# Patient Record
Sex: Female | Born: 1995 | Hispanic: Yes | Marital: Married | Smoking: Never smoker
Health system: Southern US, Community
[De-identification: ages and names within clinical notes are randomized; demographics above are authoritative.]

## PROBLEM LIST (undated history)

## (undated) ENCOUNTER — Inpatient Hospital Stay (HOSPITAL_COMMUNITY): Payer: Self-pay

## (undated) DIAGNOSIS — D649 Anemia, unspecified: Secondary | ICD-10-CM

## (undated) DIAGNOSIS — K649 Unspecified hemorrhoids: Secondary | ICD-10-CM

## (undated) HISTORY — PX: NO PAST SURGERIES: SHX2092

---
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
## 2014-02-05 ENCOUNTER — Inpatient Hospital Stay (HOSPITAL_COMMUNITY)
Admission: AD | Admit: 2014-02-05 | Discharge: 2014-02-05 | Disposition: A | Discharge status: Home / Self Care | Payer: No Typology Code available for payment source | Source: Ambulatory Visit | Attending: Obstetrics & Gynecology | Admitting: Obstetrics & Gynecology

## 2014-02-05 ENCOUNTER — Encounter (HOSPITAL_COMMUNITY): Payer: Self-pay | Admitting: *Deleted

## 2014-02-05 DIAGNOSIS — O479 False labor, unspecified: Secondary | ICD-10-CM | POA: Insufficient documentation

## 2014-02-05 NOTE — MAU Note (Signed)
Pt reports pinkish discharge x 24 hours, worsening contractions. Back pain.

## 2014-02-05 NOTE — Progress Notes (Signed)
Called provider to report pt arrival and c/o.  Provider ordered sve and monitor for 1 hour recheck if no change may d/c home with reactive NST

## 2014-02-08 ENCOUNTER — Encounter (HOSPITAL_COMMUNITY): Payer: No Typology Code available for payment source | Admitting: Anesthesiology

## 2014-02-08 ENCOUNTER — Inpatient Hospital Stay (HOSPITAL_COMMUNITY): Payer: No Typology Code available for payment source | Admitting: Anesthesiology

## 2014-02-08 ENCOUNTER — Inpatient Hospital Stay (HOSPITAL_COMMUNITY)
Admission: AD | Admit: 2014-02-08 | Discharge: 2014-02-10 | DRG: 775 | Disposition: A | Discharge status: Home / Self Care | Payer: No Typology Code available for payment source | Source: Ambulatory Visit | Attending: Obstetrics & Gynecology | Admitting: Obstetrics & Gynecology

## 2014-02-08 ENCOUNTER — Encounter (HOSPITAL_COMMUNITY): Payer: Self-pay

## 2014-02-08 DIAGNOSIS — O9989 Other specified diseases and conditions complicating pregnancy, childbirth and the puerperium: Secondary | ICD-10-CM

## 2014-02-08 DIAGNOSIS — IMO0001 Reserved for inherently not codable concepts without codable children: Secondary | ICD-10-CM

## 2014-02-08 DIAGNOSIS — O99892 Other specified diseases and conditions complicating childbirth: Secondary | ICD-10-CM

## 2014-02-08 DIAGNOSIS — Z833 Family history of diabetes mellitus: Secondary | ICD-10-CM

## 2014-02-08 DIAGNOSIS — Z22330 Carrier of Group B streptococcus: Secondary | ICD-10-CM

## 2014-02-08 LAB — TYPE AND SCREEN
ABO/RH(D): A POS
Antibody Screen: NEGATIVE

## 2014-02-08 LAB — CBC
HCT: 29.2 % — ABNORMAL LOW (ref 36.0–46.0)
Hemoglobin: 9.6 g/dL — ABNORMAL LOW (ref 12.0–15.0)
MCH: 25.5 pg — ABNORMAL LOW (ref 26.0–34.0)
MCHC: 32.9 g/dL (ref 30.0–36.0)
MCV: 77.5 fL — ABNORMAL LOW (ref 78.0–100.0)
PLATELETS: 244 10*3/uL (ref 150–400)
RBC: 3.77 MIL/uL — ABNORMAL LOW (ref 3.87–5.11)
RDW: 16 % — AB (ref 11.5–15.5)
WBC: 9.8 10*3/uL (ref 4.0–10.5)

## 2014-02-08 LAB — RPR

## 2014-02-08 MED ORDER — ZOLPIDEM TARTRATE 5 MG PO TABS: 5.0000 mg | ORAL_TABLET | Freq: Every evening | ORAL | Status: DC | PRN

## 2014-02-08 MED ORDER — OXYTOCIN 40 UNITS IN LACTATED RINGERS INFUSION - SIMPLE MED
62.5000 mL/h | INTRAVENOUS | Status: DC
  Administered 2014-02-08: 62.5 mL/h via INTRAVENOUS
  Filled 2014-02-08: qty 1000

## 2014-02-08 MED ORDER — DIPHENHYDRAMINE HCL 50 MG/ML IJ SOLN: 12.5000 mg | INTRAMUSCULAR | Status: DC | PRN

## 2014-02-08 MED ORDER — CITRIC ACID-SODIUM CITRATE 334-500 MG/5ML PO SOLN: 30.0000 mL | ORAL | Status: DC | PRN

## 2014-02-08 MED ORDER — ONDANSETRON HCL 4 MG/2ML IJ SOLN: 4.0000 mg | INTRAMUSCULAR | Status: DC | PRN

## 2014-02-08 MED ORDER — IBUPROFEN 600 MG PO TABS
600.0000 mg | ORAL_TABLET | Freq: Four times a day (QID) | ORAL | Status: DC | PRN
  Administered 2014-02-08: 600 mg via ORAL
  Filled 2014-02-08: qty 1

## 2014-02-08 MED ORDER — TETANUS-DIPHTH-ACELL PERTUSSIS 5-2.5-18.5 LF-MCG/0.5 IM SUSP: 0.5000 mL | Freq: Once | INTRAMUSCULAR | Status: DC

## 2014-02-08 MED ORDER — LANOLIN HYDROUS EX OINT
TOPICAL_OINTMENT | CUTANEOUS | Status: DC | PRN
Start: 2014-02-08 — End: 2014-02-10

## 2014-02-08 MED ORDER — ACETAMINOPHEN 325 MG PO TABS: 650.0000 mg | ORAL_TABLET | ORAL | Status: DC | PRN

## 2014-02-08 MED ORDER — PENICILLIN G POTASSIUM 5000000 UNITS IJ SOLR
2.5000 10*6.[IU] | INTRAVENOUS | Status: DC
  Administered 2014-02-08: 2.5 10*6.[IU] via INTRAVENOUS
  Filled 2014-02-08 (×6): qty 2.5

## 2014-02-08 MED ORDER — OXYCODONE-ACETAMINOPHEN 5-325 MG PO TABS: 1.0000 | ORAL_TABLET | ORAL | Status: DC | PRN

## 2014-02-08 MED ORDER — SENNOSIDES-DOCUSATE SODIUM 8.6-50 MG PO TABS
2.0000 | ORAL_TABLET | ORAL | Status: DC
  Administered 2014-02-09 – 2014-02-10 (×2): 2 via ORAL
  Filled 2014-02-08 (×2): qty 2

## 2014-02-08 MED ORDER — PHENYLEPHRINE 40 MCG/ML (10ML) SYRINGE FOR IV PUSH (FOR BLOOD PRESSURE SUPPORT)
80.0000 ug | PREFILLED_SYRINGE | INTRAVENOUS | Status: DC | PRN
  Filled 2014-02-08: qty 2

## 2014-02-08 MED ORDER — LIDOCAINE HCL (PF) 1 % IJ SOLN
30.0000 mL | INTRAMUSCULAR | Status: DC | PRN
  Filled 2014-02-08: qty 30

## 2014-02-08 MED ORDER — ONDANSETRON HCL 4 MG/2ML IJ SOLN: 4.0000 mg | Freq: Four times a day (QID) | INTRAMUSCULAR | Status: DC | PRN

## 2014-02-08 MED ORDER — OXYTOCIN BOLUS FROM INFUSION: 500.0000 mL | INTRAVENOUS | Status: DC

## 2014-02-08 MED ORDER — DIBUCAINE 1 % RE OINT: 1.0000 "application " | TOPICAL_OINTMENT | RECTAL | Status: DC | PRN

## 2014-02-08 MED ORDER — PENICILLIN G POTASSIUM 5000000 UNITS IJ SOLR
5.0000 10*6.[IU] | Freq: Once | INTRAVENOUS | Status: AC
  Administered 2014-02-08: 5 10*6.[IU] via INTRAVENOUS
  Filled 2014-02-08: qty 5

## 2014-02-08 MED ORDER — BENZOCAINE-MENTHOL 20-0.5 % EX AERO
1.0000 "application " | INHALATION_SPRAY | CUTANEOUS | Status: DC | PRN
  Filled 2014-02-08: qty 56

## 2014-02-08 MED ORDER — LACTATED RINGERS IV SOLN: 500.0000 mL | INTRAVENOUS | Status: DC | PRN

## 2014-02-08 MED ORDER — EPHEDRINE 5 MG/ML INJ
10.0000 mg | INTRAVENOUS | Status: DC | PRN
  Filled 2014-02-08: qty 4
  Filled 2014-02-08: qty 2

## 2014-02-08 MED ORDER — PRENATAL MULTIVITAMIN CH
1.0000 | ORAL_TABLET | Freq: Every day | ORAL | Status: DC
  Administered 2014-02-09: 1 via ORAL
  Filled 2014-02-08 (×2): qty 1

## 2014-02-08 MED ORDER — DIPHENHYDRAMINE HCL 25 MG PO CAPS: 25.0000 mg | ORAL_CAPSULE | Freq: Four times a day (QID) | ORAL | Status: DC | PRN

## 2014-02-08 MED ORDER — PHENYLEPHRINE 40 MCG/ML (10ML) SYRINGE FOR IV PUSH (FOR BLOOD PRESSURE SUPPORT)
80.0000 ug | PREFILLED_SYRINGE | INTRAVENOUS | Status: DC | PRN
  Filled 2014-02-08: qty 2
  Filled 2014-02-08: qty 10

## 2014-02-08 MED ORDER — WITCH HAZEL-GLYCERIN EX PADS: 1.0000 "application " | MEDICATED_PAD | CUTANEOUS | Status: DC | PRN

## 2014-02-08 MED ORDER — IBUPROFEN 600 MG PO TABS
600.0000 mg | ORAL_TABLET | Freq: Four times a day (QID) | ORAL | Status: DC
  Administered 2014-02-08 – 2014-02-10 (×7): 600 mg via ORAL
  Filled 2014-02-08 (×8): qty 1

## 2014-02-08 MED ORDER — ONDANSETRON HCL 4 MG PO TABS: 4.0000 mg | ORAL_TABLET | ORAL | Status: DC | PRN

## 2014-02-08 MED ORDER — FENTANYL 2.5 MCG/ML BUPIVACAINE 1/10 % EPIDURAL INFUSION (WH - ANES)
14.0000 mL/h | INTRAMUSCULAR | Status: DC | PRN
  Administered 2014-02-08: 14 mL/h via EPIDURAL
  Filled 2014-02-08: qty 125

## 2014-02-08 MED ORDER — LIDOCAINE HCL (PF) 1 % IJ SOLN
INTRAMUSCULAR | Status: DC | PRN
  Administered 2014-02-08 (×2): 5 mL

## 2014-02-08 MED ORDER — SIMETHICONE 80 MG PO CHEW: 80.0000 mg | CHEWABLE_TABLET | ORAL | Status: DC | PRN

## 2014-02-08 MED ORDER — LACTATED RINGERS IV SOLN
INTRAVENOUS | Status: DC
  Administered 2014-02-08: 05:00:00 via INTRAVENOUS

## 2014-02-08 MED ORDER — EPHEDRINE 5 MG/ML INJ
10.0000 mg | INTRAVENOUS | Status: DC | PRN
  Filled 2014-02-08: qty 2

## 2014-02-08 MED ORDER — LACTATED RINGERS IV SOLN
500.0000 mL | Freq: Once | INTRAVENOUS | Status: AC
  Administered 2014-02-08: 500 mL via INTRAVENOUS

## 2014-02-08 NOTE — Anesthesia Preprocedure Evaluation (Signed)

## 2014-02-08 NOTE — Anesthesia Postprocedure Evaluation (Signed)
Anesthesia Post Note  Patient: Ruth Lin  Procedure(s) Performed: * No procedures listed *  Anesthesia type: Epidural  Patient location: Mother/Baby  Post pain: Pain level controlled  Post assessment: Post-op Vital signs reviewed  Last Vitals:  Filed Vitals:   02/08/14 1805  BP: 116/71  Pulse: 96  Temp: 37.2 C  Resp: 18    Post vital signs: Reviewed  Level of consciousness:alert  Complications: No apparent anesthesia complications

## 2014-02-08 NOTE — Lactation Note (Signed)
This note was copied from the chart of Ruth Lin. [facility] given and discussed.  Encouraged to feed with early cues on demand.  Early newborn behavior discussed.  Hand expression demonstrated with colostrum visible.  Attempted to latch baby in cross cradle hold.  Baby is very sleepy and only sucked a few times.  Encouraged mom with positioning.  Mom continues to hold baby STS.  Mom to call for assist as needed.   Patient Name: Ruth Berna SpareSelena Heiner ZOXWR'UToday's Date: 02/08/2014 Reason for consult: Initial assessment   Maternal Data Has patient been taught Hand Expression?: Yes Does the patient have breastfeeding experience prior to this delivery?: No  Feeding Feeding Type: Breast Fed Length of feed:  (few sucks, sleepy)  LATCH Score/Interventions Latch: Repeated attempts needed to sustain latch, nipple held in mouth throughout feeding, stimulation needed to elicit sucking reflex. Intervention(s): Adjust position;Assist with latch;Breast compression  Audible Swallowing: None  Type of Nipple: Everted at rest and after stimulation  Comfort (Breast/Nipple): Soft / non-tender     Hold (Positioning): Assistance needed to correctly position infant at breast and maintain latch. Intervention(s): Position options;Skin to skin;Support Pillows;Breastfeeding basics reviewed  LATCH Score: 6  Lactation Tools Discussed/Used     Consult Status Consult Status: Follow-up Date: 02/09/14 Follow-up type: In-patient    Arvella MerlesJana Lynn [facility] 02/08/2014, 5:47 PM

## 2014-02-08 NOTE — H&P (Signed)
Ruth Lin is a 18 y.o. female G1P0 with IUP at 6820w6d presenting for SOL without evidence of ROM.   Patient reports that she started having regular more painful contractions that started around midnight tonight, recent history of difficulty sleeping last night. Reports regular contractions about every 3-665min.  Reports good fetal movement. Admits to small amount clear/yellow vaginal mucus-like discharge, swelling in feet. Denies any LOF or vaginal bleeding.  Prenatal History/Complications:  [facility], dating by early US. No significant complications with pregnancy.  Last US 12/30/13: - Resolved mild Left fetal pyelectasis on follow-up (detected on US 09/27/13) - EFW 2066g, 4 lb 9 oz, 35%tile  Past Medical History: Past Medical History  Diagnosis Date  . Medical history non-contributory     Past Surgical History: Past Surgical History  Procedure Laterality Date  . No past surgeries      Obstetrical History: OB History   Grav Para Term Preterm Abortions TAB SAB Ect Mult Living   1              Social History: History   Social History  . Marital Status: Married    Spouse Name: N/A    Number of Children: N/A  . Years of Education: N/A   Social History Main Topics  . Smoking status: Never Smoker   . Smokeless tobacco: None  . Alcohol Use: No  . Drug Use: No  . Sexual Activity: Yes    Birth Control/ Protection: None   Other Topics Concern  . None   Social History Narrative  . None    Family History: Family History  Problem Relation Age of Onset  . Diabetes Maternal Grandfather     Allergies: No Known Allergies  Prescriptions prior to admission  Medication Sig Dispense Refill  . Prenatal Vit-Fe Fumarate-FA (PRENATAL MULTIVITAMIN) TABS tablet Take 1 tablet by mouth daily at 12 noon.        Review of Systems: Negative unless otherwise stated in History above  Physicial Blood pressure 126/68, pulse 79, temperature 97.8 F (36.6 C),  temperature source Oral, resp. rate 20, height 5' (1.524 m), weight 80.922 kg (178 lb 6.4 oz), last menstrual period 04/27/2013. General appearance: alert and cooperative, uncomfortable with contractions Lungs: CTAB Heart: RRR, no murmurs Abdomen: soft, NTND, +active BS, appropriately gravid abdomen Extremities: Homans sign is negative, no sign of DVT. Bilateral pedal edema +1 non-pitting with mild TTP DTR's +2 Presentation: cephalic Fetal monitoring Baseline: 130 bpm, Variability: moderate, Accelerations: Present Decelerations: Variable x1 (improved with left lateral position change) Uterine activity - Regular q 3-5 min on toco, palpable  Dilation: 5 Effacement (%): 80 Station: -3;-2 Exam by:: D Simpson RN  Prenatal labs: ABO, Rh: --/--/A POS (09/17 2015) Antibody:   negative Rubella:   non-immune (08/02/13) RPR:   negative HBsAg:   negative (08/02/13) HIV:   NR GBS: Positive (02/24 0000)  GTT: 1 hr (3rd trimester, 123) - negative  Prenatal Transfer Tool  Maternal Diabetes: No  Genetic Screening: Normal Maternal Ultrasounds/Referrals: Abnormal:  Findings:   Fetal renal pyelectasis- resolved on f/u scan Fetal Ultrasounds or other Referrals:  None Maternal Substance Abuse:  No Significant Maternal Medications:  None Significant Maternal Lab Results: Lab values include: Group B Strep positive  No results found for this or any previous visit (from the past 24 hour(s)).  Assessment: Ruth Lin is a 18 y.o. G1P0 at 1620w6d by early US presented in SOL without ROM. Uncomfortable with regular palpable UCs. Recent clinic  SVE check at 1cm (02/05/14), with significant progression now, suspect active labor.  #Labor: Expectant management. If progressing well, do not plan to augment, otherwise consider Pitocin, AROM.  #Pain: No IV pain meds. No epidural. #FWB: CAT-1 reactive, variable decel x 1 (improved with position), EFW last US 12/30/13 - 2066g, 4 lb 9 oz, 35%tile #ID: GBS (positive) -  start PCN prophylaxis #Feeding: Breastfeeding #MOC: None previously - considering #Circ: No circumcision  Saralyn PilarAlexander Karamalegos, DO [facility], PGY-1 02/08/2014, 3:33 AM  I have seen and examined this patient and agree with above documentation in the resident's note. Pt presented in labor and will admit for expectant management.  EFW 7lb on leopolds.  Cat I tracing.   Rulon AbideKeli Ziomara Birenbaum, M.D. [facility] 02/08/2014 5:49 AM

## 2014-02-08 NOTE — Progress Notes (Signed)
  Subjective: Pt reports comfortable after epidural.  Feels pressure.   Objective: BP 128/57  Pulse 79  Temp(Src) 98.1 F (36.7 C) (Oral)  Resp 20  Ht 5' (1.524 m)  Wt 80.922 kg (178 lb 6.4 oz)  BMI 34.84 kg/m2  SpO2 96%  LMP 04/27/2013      FHT:  FHR: 130's bpm, variability: moderate,  accelerations:  Present,  decelerations:  Present: intermittent variable decels.  UC:   Regular by patient observation SVE:   Dilation: 10 Effacement (%): 100 Station: +2 Exam by:: muhammed CNM BBOW, AROM, clear fluid Labs: Lab Results  Component Value Date   WBC 9.8 02/08/2014   HGB 9.6* 02/08/2014   HCT 29.2* 02/08/2014   MCV 77.5* 02/08/2014   PLT 244 02/08/2014    Assessment / Plan: Spontaneous labor, progressing normally  Labor: Progressing normally Preeclampsia:  n/a Fetal Wellbeing:  Category II Pain Control:  Epidural I/D:  GBS pos Anticipated MOD:  NSVD  Ruth Lin 02/08/2014, 10:50 AM

## 2014-02-08 NOTE — MAU Note (Signed)
Pt c/o uc that began at midnight. Has not been timing them but states "they hurt really bad." Denies LOF. States some pink discharge when wiping. + FM

## 2014-02-08 NOTE — Anesthesia Procedure Notes (Signed)
Epidural Patient location during procedure: OB Start time: 02/08/2014 10:05 AM  Staffing Anesthesiologist: Brayton CavesJACKSON, Fumi Guadron Performed by: anesthesiologist   Preanesthetic Checklist Completed: patient identified, site marked, surgical consent, pre-op evaluation, timeout performed, IV checked, risks and benefits discussed and monitors and equipment checked  Epidural Patient position: sitting Prep: site prepped and draped and DuraPrep Patient monitoring: continuous pulse ox and blood pressure Approach: midline Location: L3-L4 Injection technique: LOR air  Needle:  Needle type: Tuohy  Needle gauge: 17 G Needle length: 9 cm and 9 Needle insertion depth: 5 cm cm Catheter type: closed end flexible Catheter size: 19 Gauge Catheter at skin depth: 10 cm Test dose: negative  Assessment Events: blood not aspirated, injection not painful, no injection resistance, negative IV test and no paresthesia  Additional Notes Patient identified.  Risk benefits discussed including failed block, incomplete pain control, headache, nerve damage, paralysis, blood pressure changes, nausea, vomiting, reactions to medication both toxic or allergic, and postpartum back pain.  Patient expressed understanding and wished to proceed.  All questions were answered.  Sterile technique used throughout procedure and epidural site dressed with sterile barrier dressing. No paresthesia or other complications noted.The patient did not experience any signs of intravascular injection such as tinnitus or metallic taste in mouth nor signs of intrathecal spread such as rapid motor block. Please see nursing notes for vital signs.

## 2014-02-08 NOTE — H&P (Signed)
Attestation of Attending Supervision of Fellow: Evaluation and management procedures were performed by the Fellow under my supervision and collaboration.  I have reviewed the Fellow's note and chart, and I agree with the management and plan.    

## 2014-02-09 NOTE — Lactation Note (Addendum)
This note was copied from the chart of Ruth Crissa Broughton. Lactation Consultation Note Attempting to BF in cradle position w/o NS. Mom hunching over stretching breast to get into baby's mouth. Assisted in fottball position for deeper latch. Used pillows and applied wash cloth to elevate pendulum breast. Encouraged to bring baby to mom instead of hunching over. Taught C hold instead of scissor hold, hand expression, and chin tug taught.  Patient Name: Ruth Lin Specifics of an asymmetric latch shown. Reviewed Baby & Me book's Breastfeeding Basics. Mom encouraged to feed baby w/feeding cuesEncouraged to call for assistance if needed and to verify proper latch.Mom reports increased comfort. Denied need for interpreter. Speaks good English. Has small nipples, compresses well and baby latched well w/o NS. Today's Date: 02/09/2014 Reason for consult: Follow-up assessment;Difficult latch   Maternal Data    Feeding Feeding Type: Breast Fed Length of feed: 10 min (still feeding)  LATCH Score/Interventions Latch: Grasps breast easily, tongue down, lips flanged, rhythmical sucking. Intervention(s): Adjust position;Assist with latch;Breast massage;Breast compression  Audible Swallowing: A few with stimulation Intervention(s): Skin to skin;Hand expression Intervention(s): Skin to skin;Hand expression;Alternate breast massage  Type of Nipple: Everted at rest and after stimulation (has small nipples)  Comfort (Breast/Nipple): Filling, red/small blisters or bruises, mild/mod discomfort     Hold (Positioning): Assistance needed to correctly position infant at breast and maintain latch. Intervention(s): Breastfeeding basics reviewed;Support Pillows;Position options;Skin to skin  LATCH Score: 7  Lactation Tools Discussed/Used Tools:  (has NS but didn't use for latch.)   Consult Status Consult Status: Follow-up Date: 02/09/14 Follow-up type: In-patient    Charyl DancerLaura G Seaton Hofmann 02/09/2014, 3:42  AM

## 2014-02-09 NOTE — Progress Notes (Addendum)
Post Partum Day #1 Subjective: up ad lib, voiding, tolerating PO, + flatus and Breastfeeding going better in the football position. Does complain of some pain in pernieal area but well controlled.  Objective: Blood pressure 97/63, pulse 91, temperature 98.1 F (36.7 C), temperature source Oral, resp. rate 18, height 5' (1.524 m), weight 80.922 kg (178 lb 6.4 oz), last menstrual period 04/27/2013, SpO2 98.00%, unknown if currently breastfeeding.  Physical Exam:  General: alert and no distress Lochia: appropriate Uterine Fundus: firm Incision: NA DVT Evaluation: No evidence of DVT seen on physical exam. Negative Homan's sign.   Recent Labs  02/08/14 0515  HGB 9.6*  HCT 29.2*    Assessment/Plan: 18 year old G1 now P1 ppd#1 s/p NSVD Plan for discharge tomorrow and Breastfeeding, undecided on type of contraception. Discussed options for patient to consider.     LOS: 1 day   Myriam JacobsonRobyn H Restrepo 02/09/2014, 9:25 AM   I spoke with and examined patient and agree with resident's note and plan of care.  Tawana ScaleMichael Ryan Percell Lamboy, MD OB Fellow 02/09/2014 10:23 AM

## 2014-02-09 NOTE — Lactation Note (Signed)
This note was copied from the chart of Ruth Lin. Lactation Consultation Note Follow up visit at 31hours of age.  Discussed with mom exclusively breast feeding and feels like she needs more help with latching and that is why she is giving some bottles.  Baby latches well to left breast in football hold with minimal assist.  Baby maintains latch for about 5 minutes and then sleepy, but not wanting to let go of nipple.  Discussed non nutritive suckling.  Attempted to burp and console baby away from breast, baby remains fussy.  Baby to right breast and baby hold nipple in mouth and compresses tip of nipple not actively suckling.  Encouraged mom to call for assist as needed and encouraged EBM to nipple for discomfort.  [facility] RN at bedside for report.    Patient Name: Ruth Lin Reason for consult: Follow-up assessment   Maternal Data    Feeding Feeding Type: Breast Fed Nipple Type: Slow - flow Length of feed: 5 min  LATCH Score/Interventions Latch: Grasps breast easily, tongue down, lips flanged, rhythmical sucking. Intervention(s): Assist with latch;Breast compression  Audible Swallowing: A few with stimulation Intervention(s): Skin to skin;Hand expression  Type of Nipple: Everted at rest and after stimulation  Comfort (Breast/Nipple): Filling, red/small blisters or bruises, mild/mod discomfort     Hold (Positioning): Assistance needed to correctly position infant at breast and maintain latch. Intervention(s): Breastfeeding basics reviewed;Support Pillows;Position options;Skin to skin  LATCH Score: 7  Lactation Tools Discussed/Used     Consult Status Consult Status: Follow-up Date: 02/10/14 Follow-up type: In-patient    Arvella MerlesJana Lynn Shoptaw Lin, 6:06 PM

## 2014-02-10 MED ORDER — MEASLES, MUMPS & RUBELLA VAC ~~LOC~~ INJ
0.5000 mL | INJECTION | Freq: Once | SUBCUTANEOUS | Status: AC
  Administered 2014-02-10: 0.5 mL via SUBCUTANEOUS
  Filled 2014-02-10: qty 0.5

## 2014-02-10 MED ORDER — IBUPROFEN 600 MG PO TABS
600.0000 mg | ORAL_TABLET | Freq: Four times a day (QID) | ORAL | Status: DC
Start: 2014-02-10 — End: 2014-11-11

## 2014-02-10 NOTE — Discharge Summary (Signed)
Obstetric Discharge Summary Reason for Admission: onset of labor Prenatal Procedures: NST Intrapartum Procedures: spontaneous vaginal delivery Postpartum Procedures: none Complications-Operative and Postpartum: none Hemoglobin  Date Value Ref Range Status  02/08/2014 9.6* 12.0 - 15.0 g/dL Final     HCT  Date Value Ref Range Status  02/08/2014 29.2* 36.0 - 46.0 % Final   Pt presented in early labor and progressed to deliver a liveborn female via NSVD. Post partum care was uncomplicated. She is breast/bottle feeding and undecided about contraception despite numerous discussions.  She will need to discuss at postpartum visit.    Delivery Note At 10:59 AM a viable and healthy female was delivered via Vaginal, Spontaneous Delivery (Presentation: Left Occiput Anterior).  APGAR: 9, 9; weight 7 lb 2.1 oz (3235 g).   Placenta status: Intact, Spontaneous.  Cord: 3 vessels with the following complications: None.    Anesthesia: Epidural  Episiotomy: None Lacerations: Periurethral Suture Repair:  Repair not needed, good hemostasis, well approximated.   Est. Blood Loss (mL): 300  Mom to postpartum.  Baby to Couplet care / Skin to Skin.  Myriam JacobsonRobyn H Restrepo 02/08/2014, 1:10 PM   Physical Exam:  General: alert, cooperative and no distress Lochia: appropriate Uterine Fundus: firm Incision: na DVT Evaluation: No cords or calf tenderness. No significant calf/ankle edema.  Discharge Diagnoses: Term Pregnancy-delivered  Discharge Information: Date: 02/10/2014 Activity: pelvic rest Diet: routine Medications: PNV and Ibuprofen Condition: stable Instructions: refer to practice specific booklet Discharge to: home Follow-up Information   [facility] Schedule an appointment as soon as possible for a visit in 6 weeks.   Contact information:   [address] [place] [id number] [phone]      Newborn Data: Live born female  Birth Weight: 7 lb  2.1 oz (3235 g) APGAR: 9, 9  Home with mother.  Matthew Pais L Gwendoline Judy 02/10/2014, 7:50 AM

## 2014-02-10 NOTE — Lactation Note (Signed)
This note was copied from the chart of Ruth Lin. Lactation Consultation Note Follow up consult:  Mother had difficulty latching baby in cross cradle on right breast. Assisted in repositioning baby to fb hold.  Sucks and swallows observed.  Mother massaging breasts to keep baby stimulated. Reviewed burping and checking diaper before feeding.   Mom encouraged to feed baby 8-12 times/24 hours and with feeding cues.  Reviewed supply and demand, engorgement care and volume guidelines. Patient Name: Ruth Lin's Date: 02/10/2014 Reason for consult: Follow-up assessment   Maternal Data Formula Feeding for Exclusion: No  Feeding Feeding Type: Breast Fed Length of feed: 15 min  LATCH Score/Interventions Latch: Grasps breast easily, tongue down, lips flanged, rhythmical sucking. Intervention(s): Adjust position  Audible Swallowing: A few with stimulation Intervention(s): Skin to skin Intervention(s): Alternate breast massage  Type of Nipple: Everted at rest and after stimulation  Comfort (Breast/Nipple): Soft / non-tender     Hold (Positioning): Assistance needed to correctly position infant at breast and maintain latch.  LATCH Score: 8  Lactation Tools Discussed/Used     Consult Status Consult Status: Complete    Hardie PulleyRuth Boschen [name] 02/10/2014, 11:41 AM

## 2014-02-10 NOTE — Discharge Instructions (Signed)
Parto vaginal, Cuidados posteriores  °(Vaginal Delivery, Care After) °Siga estas instrucciones durante las próximas semanas. Estas indicaciones para el alta [place] proporcionan información general acerca de cómo deberá cuidarse después del parto. El médico también podrá darle instrucciones específicas. El tratamiento ha sido planificado según las prácticas médicas actuales, pero en algunos casos pueden ocurrir problemas. Comuníquese con el médico si tiene algún problema o tiene preguntas al volver a su casa.  °INSTRUCCIONES PARA EL CUIDADO EN EL HOGAR  °· Tome sólo medicamentos de venta libre o recetados, según las indicaciones del médico o del farmacéutico. °· No beba alcohol, especialmente si está amamantando o toma analgésicos. °· No mastique tabaco ni fume. °· No consuma drogas. °· Continúe con un adecuado cuidado perineal. El buen cuidado perineal incluye: °· Higienizarse de adelante hacia atrás. °· Mantener la zona perineal limpia. °· No use tampones ni duchas vaginales hasta que su médico la autorice. °· Dúchese, lávese el cabello y tome baños de inmersión según las indicaciones de su médico. °· Utilice un sostén que [place] ajuste bien y que brinde buen soporte a sus mamas. °· Consuma alimentos saludables. °· Beba suficiente líquido para mantener la orina clara o de color amarillo pálido. °· Consuma alimentos ricos en fibra como cereales y panes integrales, arroz, frijoles y frutas y verduras frescas todos los días. Estos alimentos pueden ayudarla a prevenir o aliviar el estreñimiento. °· Siga las recomendaciones de su médico relacionadas con la reanudación de actividades como subir escaleras, conducir automóviles, levantar objetos, hacer ejercicios o viajar. °· Hable con su médico acerca de reanudar la actividad sexual. Volver a la actividad sexual depende del riesgo de infección, la velocidad de la curación y la comodidad y su deseo de reanudarla. °· Trate de que alguien la ayude con las actividades del hogar y con  el recién nacido al menos durante un par de días después de salir del hospital. °· Descanse todo lo que pueda. Trate de descansar o tomar una siesta mientras el bebé está durmiendo. °· Aumente sus actividades gradualmente. °· Cumpla con todas las visitas de control programadas para después del parto. Es muy importante asistir a todas las citas programadas de seguimiento. En estas citas, su médico va a controlarla para asegurarse de que esté sanando física y emocionalmente. °SOLICITE ATENCIÓN MÉDICA SI:  °· Elimina coágulos grandes por la vagina. Guarde algunos coágulos para mostrarle al médico. °· Tiene una secreción con feo olor que proviene de la vagina. °· Tiene dificultad para orinar. °· Orina con frecuencia. °· Siente dolor al orinar. °· Nota un cambio en sus movimientos intestinales. °· Aumenta el enrojecimiento, el dolor o la hinchazón en la zona de la incisión vaginal (episiotomía) o el desgarro vaginal. °· Tiene pus que drena por la episiotomía o el desgarro vaginal. °· La episiotomía o el desgarro vaginal se abren. °· Sus mamas Taddei duelen, están duras o enrojecidas. °· Sufre un dolor intenso de cabeza. °· Tiene visión borrosa o ve manchas. °· Se siente triste o deprimida. °· Tiene pensamientos acerca de lastimarse o dañar al recién nacido. °· Tiene preguntas acerca de su cuidado personal, el cuidado del recién nacido o acerca de los medicamentos. °· Se siente mareada o sufre un desmayo. °· Tiene una erupción. °· Tiene náuseas o vómitos. °· Usted amamantó al bebé y no ha tenido su período menstrual dentro de las 12 semanas después de dejar de amamantar. °· No amamanta al bebé y no tuvo su período menstrual en las últimas 12° semanas después del   parto. °· Tiene fiebre. °SOLICITE ATENCIÓN MÉDICA DE INMEDIATO SI:  °· Siente dolor persistente. °· Siente dolor en el pecho. °· Sandt falta el aire. °· Se desmaya. °· Siente dolor en la pierna. °· Siente dolor en el estómago. °· El sangrado vaginal satura dos o más  apósitos en 1 hora. °ASEGÚRESE DE QUE:  °· Comprende estas instrucciones. °· Controlará su enfermedad. °· Recibirá ayuda de inmediato si no mejora o si empeora. °Document Released: 10/07/2005 Document Revised: 06/09/2013 °[employer]® Patient Information ©2014 [employer]. ° °

## 2014-02-20 NOTE — Discharge Summary (Signed)
`````  Attestation of Attending Supervision of Advanced Practitioner: Evaluation and management procedures were performed by the PA/NP/CNM/OB Fellow under my supervision/collaboration. Chart reviewed and agree with management and plan.  Ruth BurrowJohn V Braelen Sproule 02/20/2014 12:34 AM

## 2014-08-22 ENCOUNTER — Encounter (HOSPITAL_COMMUNITY): Payer: Self-pay

## 2014-11-11 ENCOUNTER — Encounter (HOSPITAL_COMMUNITY): Payer: Self-pay | Admitting: General Practice

## 2014-11-11 ENCOUNTER — Emergency Department (HOSPITAL_COMMUNITY)
Admission: EM | Admit: 2014-11-11 | Discharge: 2014-11-11 | Disposition: A | Discharge status: Home / Self Care | Payer: No Typology Code available for payment source | Attending: Emergency Medicine | Admitting: Emergency Medicine

## 2014-11-11 DIAGNOSIS — E860 Dehydration: Secondary | ICD-10-CM | POA: Insufficient documentation

## 2014-11-11 DIAGNOSIS — R1084 Generalized abdominal pain: Secondary | ICD-10-CM

## 2014-11-11 DIAGNOSIS — Z791 Long term (current) use of non-steroidal anti-inflammatories (NSAID): Secondary | ICD-10-CM | POA: Insufficient documentation

## 2014-11-11 DIAGNOSIS — R112 Nausea with vomiting, unspecified: Secondary | ICD-10-CM

## 2014-11-11 DIAGNOSIS — R197 Diarrhea, unspecified: Secondary | ICD-10-CM

## 2014-11-11 DIAGNOSIS — R000 Tachycardia, unspecified: Secondary | ICD-10-CM | POA: Insufficient documentation

## 2014-11-11 DIAGNOSIS — A0839 Other viral enteritis: Secondary | ICD-10-CM | POA: Insufficient documentation

## 2014-11-11 DIAGNOSIS — Z79899 Other long term (current) drug therapy: Secondary | ICD-10-CM | POA: Insufficient documentation

## 2014-11-11 DIAGNOSIS — A084 Viral intestinal infection, unspecified: Secondary | ICD-10-CM

## 2014-11-11 LAB — CBC WITH DIFFERENTIAL/PLATELET
BASOS PCT: 0 % (ref 0–1)
Basophils Absolute: 0 10*3/uL (ref 0.0–0.1)
EOS PCT: 1 % (ref 0–5)
Eosinophils Absolute: 0.1 10*3/uL (ref 0.0–0.7)
HEMATOCRIT: 37.4 % (ref 36.0–46.0)
HEMOGLOBIN: 12.6 g/dL (ref 12.0–15.0)
LYMPHS PCT: 8 % — AB (ref 12–46)
Lymphs Abs: 0.7 10*3/uL (ref 0.7–4.0)
MCH: 27.3 pg (ref 26.0–34.0)
MCHC: 33.7 g/dL (ref 30.0–36.0)
MCV: 81.1 fL (ref 78.0–100.0)
Monocytes Absolute: 0.4 10*3/uL (ref 0.1–1.0)
Monocytes Relative: 4 % (ref 3–12)
NEUTROS PCT: 87 % — AB (ref 43–77)
Neutro Abs: 7.9 10*3/uL — ABNORMAL HIGH (ref 1.7–7.7)
Platelets: 291 10*3/uL (ref 150–400)
RBC: 4.61 MIL/uL (ref 3.87–5.11)
RDW: 14 % (ref 11.5–15.5)
WBC: 9.1 10*3/uL (ref 4.0–10.5)

## 2014-11-11 LAB — COMPREHENSIVE METABOLIC PANEL
ALBUMIN: 4.1 g/dL (ref 3.5–5.2)
ALK PHOS: 118 U/L — AB (ref 39–117)
ALT: 35 U/L (ref 0–35)
ANION GAP: 7 (ref 5–15)
AST: 31 U/L (ref 0–37)
BILIRUBIN TOTAL: 0.6 mg/dL (ref 0.3–1.2)
BUN: 15 mg/dL (ref 6–23)
CALCIUM: 9.1 mg/dL (ref 8.4–10.5)
CHLORIDE: 103 meq/L (ref 96–112)
CO2: 29 mmol/L (ref 19–32)
CREATININE: 0.72 mg/dL (ref 0.50–1.10)
Glucose, Bld: 90 mg/dL (ref 70–99)
POTASSIUM: 3.5 mmol/L (ref 3.5–5.1)
SODIUM: 139 mmol/L (ref 135–145)
TOTAL PROTEIN: 7.7 g/dL (ref 6.0–8.3)

## 2014-11-11 LAB — LIPASE, BLOOD: Lipase: 33 U/L (ref 11–59)

## 2014-11-11 LAB — URINALYSIS, ROUTINE W REFLEX MICROSCOPIC
Bilirubin Urine: NEGATIVE
Glucose, UA: NEGATIVE mg/dL
Hgb urine dipstick: NEGATIVE
KETONES UR: 15 mg/dL — AB
Leukocytes, UA: NEGATIVE
Nitrite: NEGATIVE
PROTEIN: NEGATIVE mg/dL
Specific Gravity, Urine: 1.03 (ref 1.005–1.030)
Urobilinogen, UA: 0.2 mg/dL (ref 0.0–1.0)
pH: 5.5 (ref 5.0–8.0)

## 2014-11-11 LAB — POC URINE PREG, ED: Preg Test, Ur: NEGATIVE

## 2014-11-11 MED ORDER — ONDANSETRON HCL 8 MG PO TABS: 8.0000 mg | ORAL_TABLET | Freq: Three times a day (TID) | ORAL | Status: DC | PRN

## 2014-11-11 MED ORDER — SODIUM CHLORIDE 0.9 % IV BOLUS (SEPSIS)
1000.0000 mL | Freq: Once | INTRAVENOUS | Status: AC
  Administered 2014-11-11: 1000 mL via INTRAVENOUS

## 2014-11-11 MED ORDER — ONDANSETRON HCL 4 MG/2ML IJ SOLN
4.0000 mg | Freq: Once | INTRAMUSCULAR | Status: AC
  Administered 2014-11-11: 4 mg via INTRAVENOUS
  Filled 2014-11-11: qty 2

## 2014-11-11 MED ORDER — MORPHINE SULFATE 4 MG/ML IJ SOLN
4.0000 mg | Freq: Once | INTRAMUSCULAR | Status: AC
Start: 2014-11-11 — End: 2014-11-11
  Administered 2014-11-11: 4 mg via INTRAVENOUS
  Filled 2014-11-11: qty 1

## 2014-11-11 NOTE — Discharge Instructions (Signed)
Use zofran as prescribed, as needed for nausea. Stay well hydrated with small sips of fluids throughout the day. Use tylenol as needed for pain. Follow a BRAT (banana-rice-applesauce-toast) diet as described below for the next 24-48 hours. The 'BRAT' diet is suggested, then progress to diet as tolerated as symptoms abate. Call if bloody stools, persistent diarrhea, vomiting, fever or abdominal pain. See your regular doctor in 1 week for recheck. Return to ER for changing or worsening of symptoms.  Food Choices to Help Relieve Diarrhea When you have diarrhea, the foods you eat and your eating habits are very important. Choosing the right foods and drinks can help relieve diarrhea. Also, because diarrhea can last up to 7 days, you need to replace lost fluids and electrolytes (such as sodium, potassium, and chloride) in order to help prevent dehydration.  WHAT GENERAL GUIDELINES DO I NEED TO FOLLOW?  Slowly drink 1 cup (8 oz) of fluid for each episode of diarrhea. If you are getting enough fluid, your urine will be clear or pale yellow.  Eat starchy foods. Some good choices include white rice, white toast, pasta, low-fiber cereal, baked potatoes (without the skin), saltine crackers, and bagels.  Avoid large servings of any cooked vegetables.  Limit fruit to two servings per day. A serving is  cup or 1 small piece.  Choose foods with less than 2 g of fiber per serving.  Limit fats to less than 8 tsp (38 g) per day.  Avoid fried foods.  Eat foods that have probiotics in them. Probiotics can be found in certain dairy products.  Avoid foods and beverages that may increase the speed at which food moves through the stomach and intestines (gastrointestinal tract). Things to avoid include:  High-fiber foods, such as dried fruit, raw fruits and vegetables, nuts, seeds, and whole grain foods.  Spicy foods and high-fat foods.  Foods and beverages sweetened with high-fructose corn syrup, honey, or  sugar alcohols such as xylitol, sorbitol, and mannitol. WHAT FOODS ARE RECOMMENDED? Grains White rice. White, [place], or pita breads (fresh or toasted), including plain rolls, buns, or bagels. White pasta. Saltine, soda, or graham crackers. Pretzels. Low-fiber cereal. Cooked cereals made with water (such as cornmeal, farina, or cream cereals). Plain muffins. Matzo. Melba toast. Zwieback.  Vegetables Potatoes (without the skin). Strained tomato and vegetable juices. Most well-cooked and canned vegetables without seeds. Tender lettuce. Fruits Cooked or canned applesauce, apricots, cherries, fruit cocktail, grapefruit, peaches, pears, or plums. Fresh bananas, apples without skin, cherries, grapes, cantaloupe, grapefruit, peaches, oranges, or plums.  Meat and Other Protein Products Baked or boiled chicken. Eggs. Tofu. Fish. Seafood. Smooth peanut butter. Ground or well-cooked tender beef, ham, veal, lamb, pork, or poultry.  Dairy Plain yogurt, kefir, and unsweetened liquid yogurt. Lactose-free milk, buttermilk, or soy milk. Plain hard cheese. Beverages Sport drinks. Clear broths. Diluted fruit juices (except prune). Regular, caffeine-free sodas such as ginger ale. Water. Decaffeinated teas. Oral rehydration solutions. Sugar-free beverages not sweetened with sugar alcohols. Other Bouillon, broth, or soups made from recommended foods.  The items listed above may not be a complete list of recommended foods or beverages. Contact your dietitian for more options. WHAT FOODS ARE NOT RECOMMENDED? Grains Whole grain, whole wheat, bran, or rye breads, rolls, pastas, crackers, and cereals. Wild or brown rice. Cereals that contain more than 2 g of fiber per serving. Corn tortillas or taco shells. Cooked or dry oatmeal. Granola. Popcorn. Vegetables Raw vegetables. Cabbage, broccoli, Brussels sprouts, artichokes, baked beans, beet greens,  corn, kale, legumes, peas, sweet potatoes, and yams. Potato skins. Cooked  spinach and cabbage. Fruits Dried fruit, including raisins and dates. Raw fruits. Stewed or dried prunes. Fresh apples with skin, apricots, mangoes, pears, raspberries, and strawberries.  Meat and Other Protein Products Chunky peanut butter. Nuts and seeds. Beans and lentils. Tomasa Blase.  Dairy High-fat cheeses. Milk, chocolate milk, and beverages made with milk, such as milk shakes. Cream. Ice cream. Sweets and Desserts Sweet rolls, doughnuts, and sweet breads. Pancakes and waffles. Fats and Oils Butter. Cream sauces. Margarine. Salad oils. Plain salad dressings. Olives. Avocados.  Beverages Caffeinated beverages (such as coffee, tea, soda, or energy drinks). Alcoholic beverages. Fruit juices with pulp. Prune juice. Soft drinks sweetened with high-fructose corn syrup or sugar alcohols. Other Coconut. Hot sauce. Chili powder. Mayonnaise. Gravy. Cream-based or milk-based soups.  The items listed above may not be a complete list of foods and beverages to avoid. Contact your dietitian for more information. WHAT SHOULD I DO IF I BECOME DEHYDRATED? Diarrhea can sometimes lead to dehydration. Signs of dehydration include dark urine and dry mouth and skin. If you think you are dehydrated, you should rehydrate with an oral rehydration solution. These solutions can be purchased at pharmacies, retail stores, or online.  Drink -1 cup (120-240 mL) of oral rehydration solution each time you have an episode of diarrhea. If drinking this amount makes your diarrhea worse, try drinking smaller amounts more often. For example, drink 1-3 tsp (5-15 mL) every 5-10 minutes.  A general rule for staying hydrated is to drink 1-2 L of fluid per day. Talk to your health care provider about the specific amount you should be drinking each day. Drink enough fluids to keep your urine clear or pale yellow. Document Released: 12/28/2003 Document Revised: 10/12/2013 Document Reviewed: 08/30/2013 [facility] Patient Information 2015  [place], [place]. This information is not intended to replace advice given to you by your health care provider. Make sure you discuss any questions you have with your health care provider.

## 2014-11-11 NOTE — ED Notes (Signed)
Gave pt sprite for PO challenge 

## 2014-11-11 NOTE — ED Notes (Signed)
Pt reports N/V/D since 0100 am this morning. Per patient she has vomited X7 this AM. Pt reports mild abdominal tenderness.  Pt took 400mg  of Tylenol.

## 2014-11-11 NOTE — ED Provider Notes (Signed)
CSN: [id number]     Arrival date & time 11/11/14  0701 History   First MD Initiated Contact with Patient 11/11/14 0719     Chief Complaint  Patient presents with  . Emesis  . Diarrhea     (Consider location/radiation/quality/duration/timing/severity/associated sxs/prior Treatment) HPI Comments: Ruth Lin is a 19 y.o. Female with no significant PMHx who is currently breastfeeding, who presents to the ED with complaints of nausea, vomiting, diarrhea, and generalized abdominal pain that began around 1 AM. She reports she has had 7 episodes of nonbloody nonbilious emesis consisting of stomach contents, and 7 episodes of watery nonbloody diarrhea. Her abdominal pain is 7/10 generalized sharp achy constant nonradiating, which worsens when she has the urge to vomit or have diarrhea, and has been unrelieved with Tylenol given that she was unable to keep the Tylenol down and she vomited it back up. She endorses positive sick contacts, her infant son was seen for the same symptoms. She denies any fevers, chills, chest pain, shortness breath, obstipation, constipation, dysuria, hematuria, vaginal bleeding or discharge, numbness, tingling, weakness, back pain, flank pain, EtOH use, NSAID use, travel, or suspicious food intake. She ate chicken, cheese, and mashed potatoes made by herself last night. LMP was 2 yrs ago (states she hasn't had one since her son was conceived since she's been breastfeeding)  Patient is a 19 y.o. female presenting with vomiting and diarrhea. The history is provided by the patient. No language interpreter was used.  Emesis Severity:  Moderate Duration:  6 hours Timing:  Constant Number of daily episodes:  7 Quality:  Stomach contents Progression:  Unchanged Chronicity:  New Recent urination:  Normal Relieved by:  None tried Worsened by:  Nothing tried Ineffective treatments:  None tried Associated symptoms: abdominal pain and diarrhea   Associated symptoms: no arthralgias, no  chills, no fever, no headaches, no myalgias and no URI   Abdominal pain:    Location:  Generalized   Quality:  Sharp   Severity:  Moderate   Onset quality:  Sudden   Duration:  6 hours   Timing:  Constant   Progression:  Unchanged   Chronicity:  New Risk factors: sick contacts   Risk factors: no alcohol use, not pregnant now, no suspect food intake and no travel to endemic areas   Diarrhea Quality:  Watery Severity:  Moderate Onset quality:  Sudden Number of episodes:  7 Duration:  6 hours Timing:  Constant Progression:  Unchanged Relieved by:  None tried Worsened by:  Nothing tried Ineffective treatments:  None tried Associated symptoms: abdominal pain and vomiting   Associated symptoms: no arthralgias, no chills, no fever, no headaches, no myalgias and no URI   Risk factors: sick contacts     Past Medical History  Diagnosis Date  . Medical history non-contributory    Past Surgical History  Procedure Laterality Date  . No past surgeries     Family History  Problem Relation Age of Onset  . Diabetes Maternal Grandfather    History  Substance Use Topics  . Smoking status: Never Smoker   . Smokeless tobacco: Never Used  . Alcohol Use: No   OB History    Gravida Para Term Preterm AB TAB SAB Ectopic Multiple Living   _0 Review of Systems  Constitutional: Negative for fever and chills.  Respiratory: Negative for shortness of breath.   Cardiovascular: Negative for chest pain.  Gastrointestinal: Positive  for nausea, vomiting, abdominal pain and diarrhea. Negative for constipation, blood in stool, abdominal distention and anal bleeding.  Genitourinary: Negative for dysuria, hematuria, flank pain, vaginal bleeding and vaginal discharge.  Musculoskeletal: Negative for myalgias and arthralgias.  Skin: Negative for rash.  Allergic/Immunologic: Negative for immunocompromised state.  Neurological: Negative for weakness, light-headedness, numbness and  headaches.  Hematological: Negative for adenopathy.   10 Systems reviewed and are negative for acute change except as noted in the HPI.    Allergies  Review of patient's allergies indicates no known allergies.  Home Medications   Prior to Admission medications   Medication Sig Start Date End Date Taking? Authorizing Provider  ibuprofen (ADVIL,MOTRIN) 600 MG tablet Take 1 tablet (600 mg total) by mouth every 6 (six) hours. 02/10/14   Kassie Mends, MD  Prenatal Vit-Fe Fumarate-FA (PRENATAL MULTIVITAMIN) TABS tablet Take 1 tablet by mouth daily at 12 noon.    Historical Provider, MD   BP 119/71 mmHg  Pulse 103  Temp(Src) 98.6 F (37 C) (Oral)  Resp 14  Ht 5' (1.524 m)  Wt 150 lb (68.04 kg)  BMI 29.30 kg/m2  SpO2 95%  Breastfeeding? Yes Physical Exam  Constitutional: She is oriented to person, place, and time. She appears well-developed and well-nourished.  Non-toxic appearance. No distress.  Afebrile, nontoxic, NAD, mildly tachycardic but otherwise VSS  HENT:  Head: Normocephalic and atraumatic.  Mouth/Throat: Oropharynx is clear and moist. Mucous membranes are dry (mild).  Mildly dry mucous membranes  Eyes: Conjunctivae and EOM are normal. Right eye exhibits no discharge. Left eye exhibits no discharge.  Neck: Normal range of motion. Neck supple.  Cardiovascular: Regular rhythm, normal heart sounds and intact distal pulses.  Tachycardia present.  Exam reveals no gallop and no friction rub.   No murmur heard. Slight tachycardia  Pulmonary/Chest: Effort normal and breath sounds normal. No respiratory distress. She has no decreased breath sounds. She has no wheezes. She has no rhonchi. She has no rales.  Abdominal: Soft. Normal appearance and bowel sounds are normal. She exhibits no distension. There is generalized tenderness. There is no rigidity, no rebound, no guarding, no CVA tenderness, no tenderness at McBurney's point and negative Murphy's sign.    Soft, ND, +BS throughout,  with generalized TTP mostly located in the right side of the abdomen but without focal area of tenderness, no r/g/r, neg murphy's, neg mcburney's, no CVA TTP   Musculoskeletal: Normal range of motion.  Neurological: She is alert and oriented to person, place, and time. She has normal strength. No sensory deficit.  Skin: Skin is warm, dry and intact. No rash noted.  Psychiatric: She has a normal mood and affect.  Nursing note and vitals reviewed.   ED Course  Procedures (including critical care time) Labs Review Labs Reviewed  CBC WITH DIFFERENTIAL - Abnormal; Notable for the following:    Neutrophils Relative % 87 (*)    Neutro Abs 7.9 (*)    Lymphocytes Relative 8 (*)    All other components within normal limits  COMPREHENSIVE METABOLIC PANEL - Abnormal; Notable for the following:    Alkaline Phosphatase 118 (*)    All other components within normal limits  URINALYSIS, ROUTINE W REFLEX MICROSCOPIC - Abnormal; Notable for the following:    Ketones, ur 15 (*)    All other components within normal limits  LIPASE, BLOOD  POC URINE PREG, ED    Imaging Review No results found.   EKG Interpretation None  MDM   Final diagnoses:  Non-intractable vomiting with nausea, vomiting of unspecified type  Diarrhea  Viral gastroenteritis  Generalized abdominal pain    19 y.o. female with N/V/D and mild abd pain x6hrs, +sick contacts at home (infant son). Abd exam with mild tenderness, nonperitoneal, neg murphy's and mcburney's. Doubt need for further imaging. Doubt need for pelvic exam given no lower abdominal tenderness, and no vaginal complaints. Likely viral gastroenteritis. Will obtain labs, give fluids and zofran. Discussed options of giving tylenol for pain, or using low dose of morphine although since she's breast feeding there was a small risk to infant son. She opted to take low dose of morphine and verbalized understanding of the risks. Will reassess shortly.  8:55 AM Upreg  neg, U/A with some ketones indicative of dehydration but otherwise WNL. CBC w/diff unremarkable. CMP with mildly elevated alk phos but otherwise WNL. Lipase WNL. BP trended down to 97/45, pt is young and healthy therefore unconcerning but will give another fluid bolus. Will reassess shortly.   9:19 AM Pt feeling improved. Will PO challenge and plan for d/c.  9:42 AM Pt tolerating PO well. BPs improved, still slightly tachycardic intermittently likely from dehydration but for majority of stay she's had regular rate and rhythm. Given that symptoms have improved and she is tolerating PO well, discussed importance of good oral hydration at home, and use of Zofran for nausea. Will have her f/up with PCP in 1wk. Discussed tylenol use for pain. I explained the diagnosis and have given explicit precautions to return to the ER including for any other new or worsening symptoms. The patient understands and accepts the medical plan as it's been dictated and I have answered their questions. Discharge instructions concerning home care and prescriptions have been given. The patient is STABLE and is discharged to home in good condition.  BP 103/72 mmHg  Pulse 95  Temp(Src) 98.6 F (37 C) (Oral)  Resp 14  Ht 5' (1.524 m)  Wt 150 lb (68.04 kg)  BMI 29.30 kg/m2  SpO2 100%  Breastfeeding? Yes  Meds ordered this encounter  Medications  . ondansetron (ZOFRAN) injection 4 mg    Sig:   . morphine 4 MG/ML injection 4 mg    Sig:   . sodium chloride 0.9 % bolus 1,000 mL    Sig:   . sodium chloride 0.9 % bolus 1,000 mL    Sig:   . ondansetron (ZOFRAN) 8 MG tablet    Sig: Take 1 tablet (8 mg total) by mouth every 8 (eight) hours as needed for nausea or vomiting.    Dispense:  21 tablet    Refill:  0    Order Specific Question:  Supervising Provider    Answer:  KASHINA, MECUM [id number]chio Camprubi-Soms, PA-C 11/11/14 0945  Charlesetta Shanks, MD 11/11/14 1228

## 2015-07-23 IMAGING — US US OB COMP LESS 14 WK
1 series · 13 of 13 positions shown · non-contrast
Comparison: No priors.

CLINICAL DATA: Early pregnancy with vaginal bleeding.

EXAM:
OBSTETRIC <14 WK ULTRASOUND
TECHNIQUE: Transabdominal ultrasound was performed for evaluation of the
gestation as well as the maternal uterus and adnexal regions.

[Series 1: us ob comp less 14 wks · 13 acquisitions, 13 frames shown]
[im 1/13]
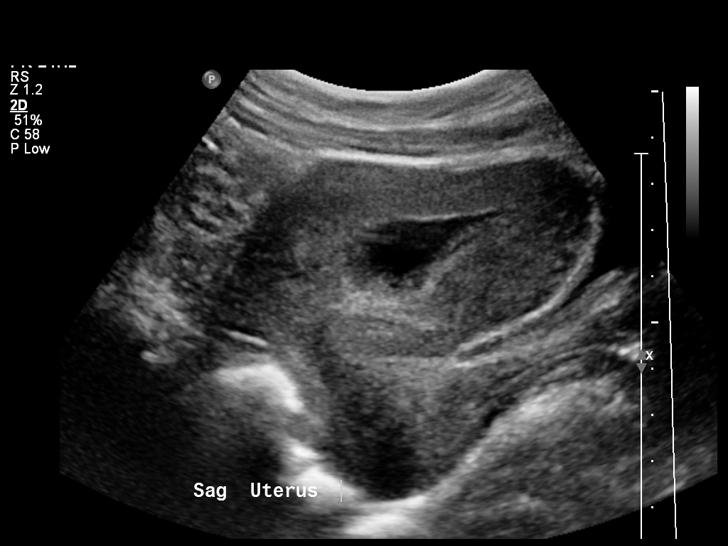
[im 2/13]
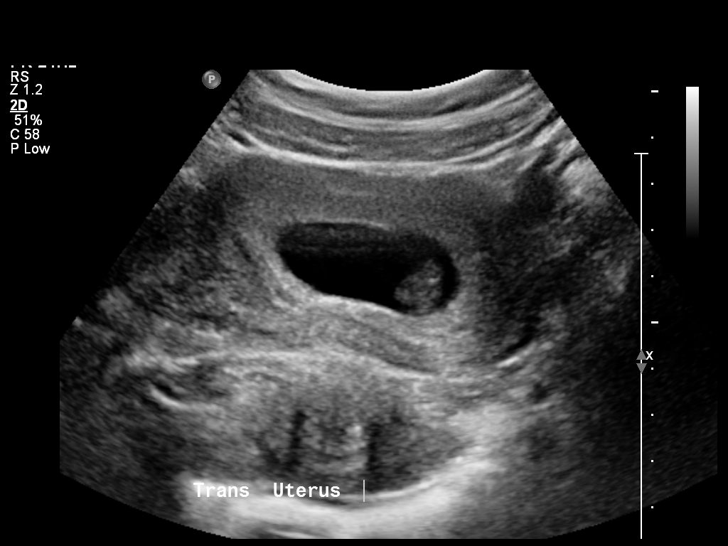
[im 3/13]
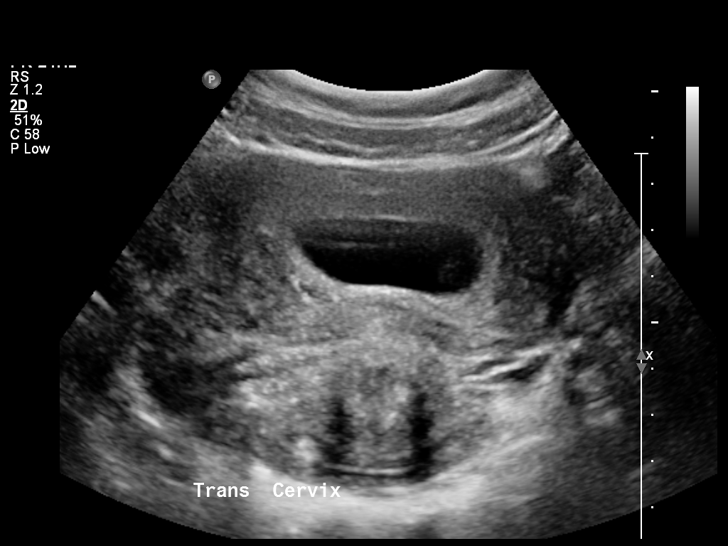
[im 4/13]
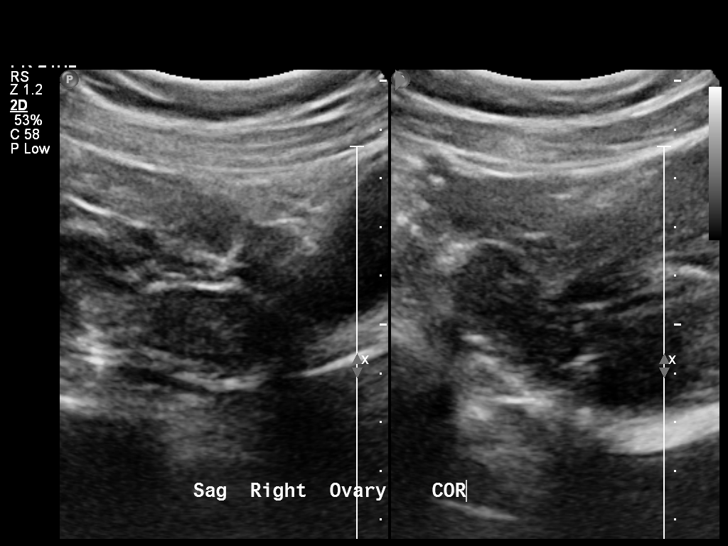
[im 5/13]
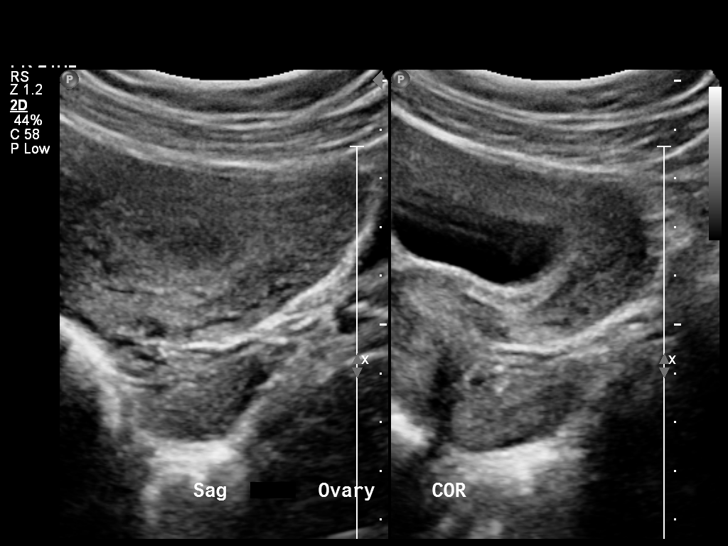
[im 6/13]
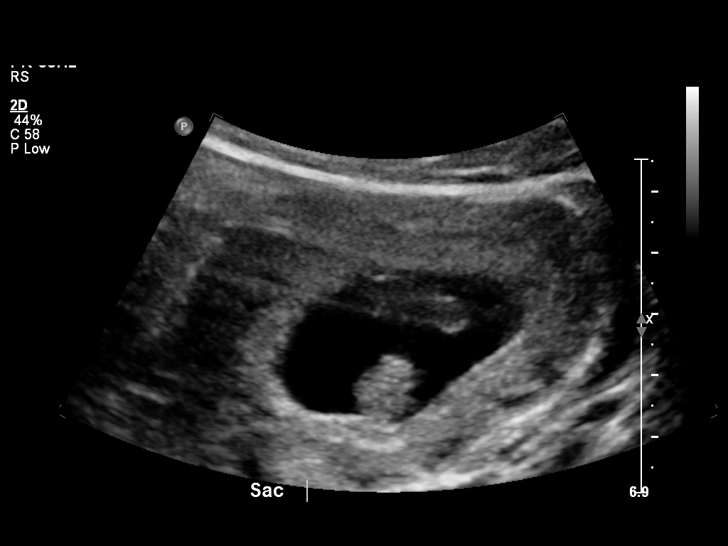
[im 7/13]
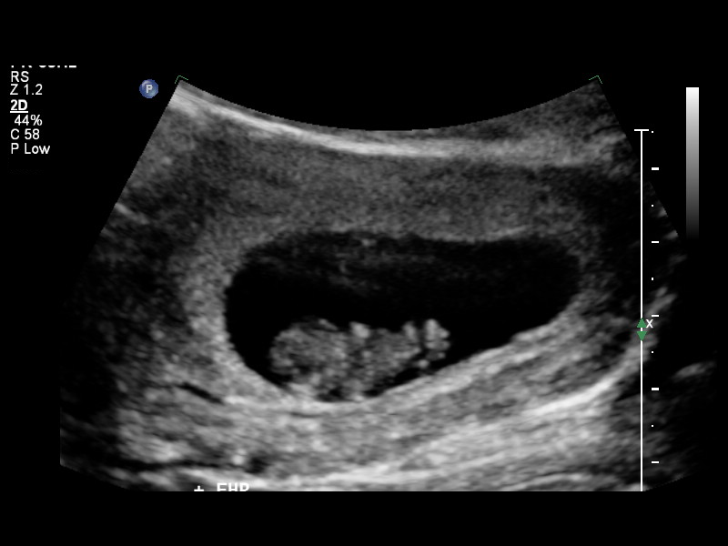
[im 8/13]
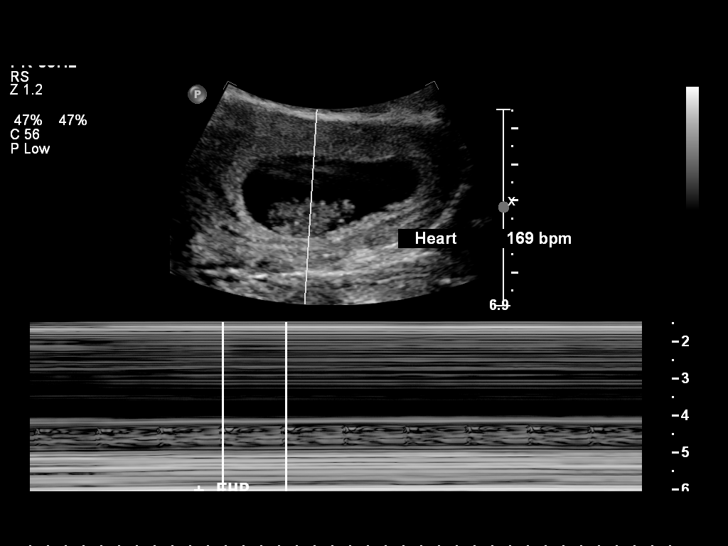
[im 9/13]
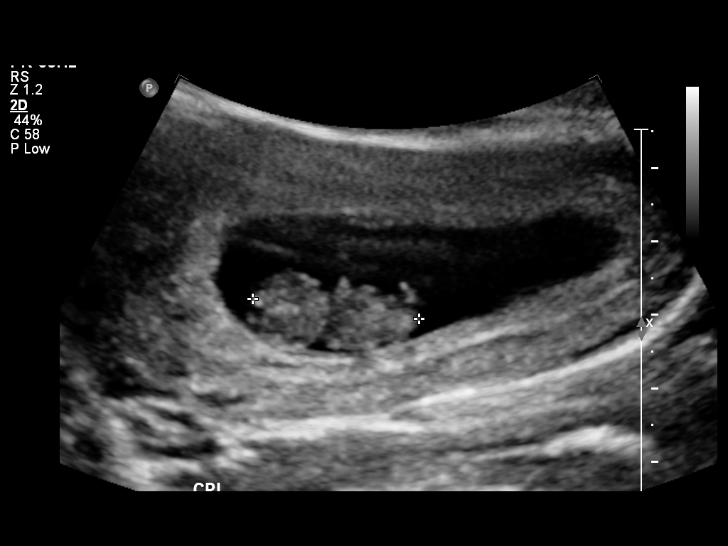
[im 10/13]
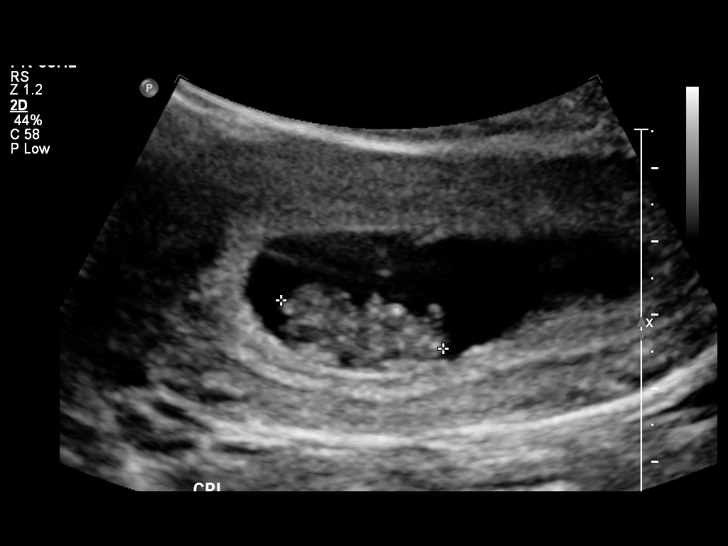
[im 11/13]
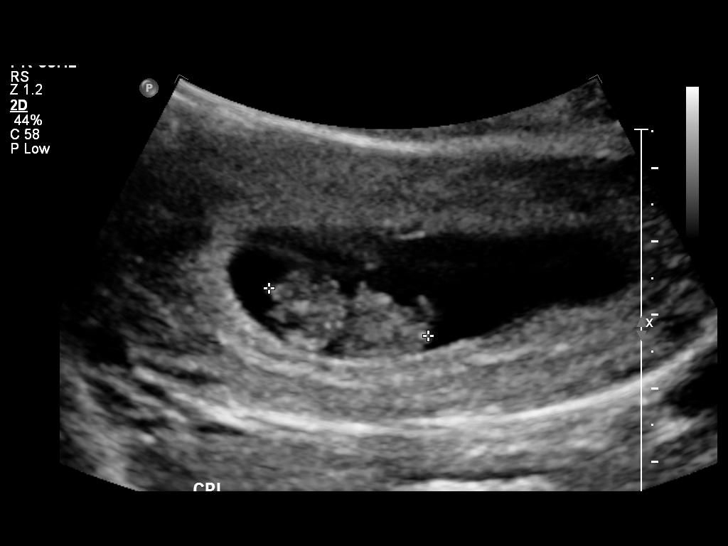
[im 12/13]
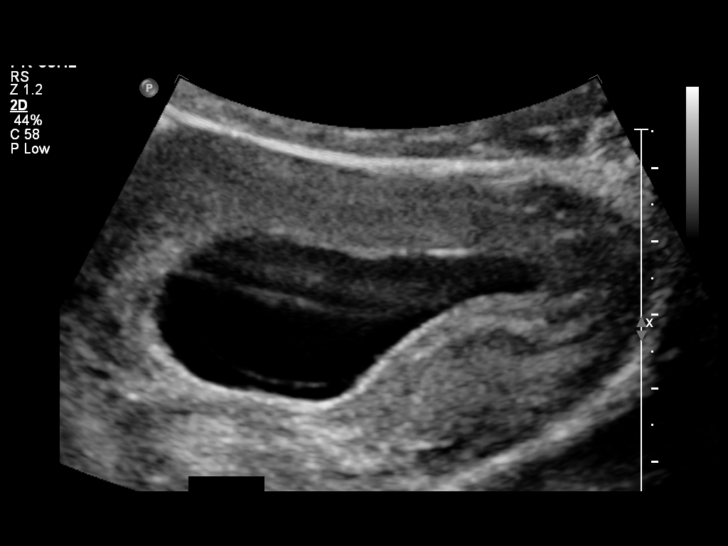
[im 13/13]
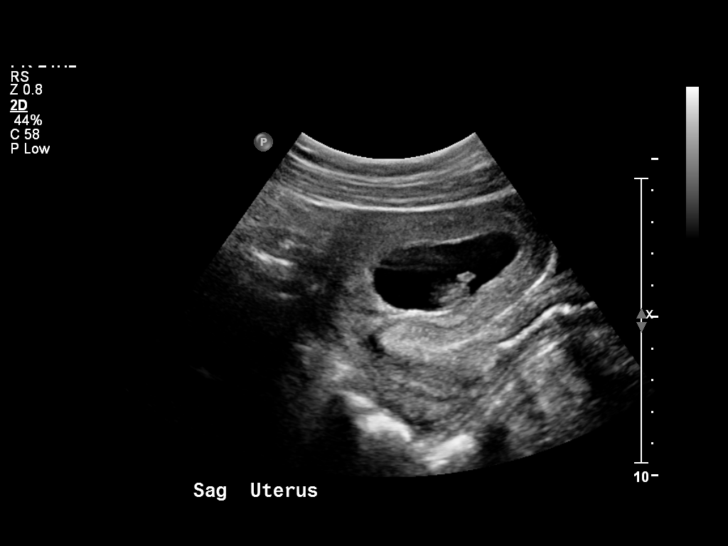

[13 of 13 positions shown; findings below may reference images not displayed]

FINDINGS: Intrauterine gestational sac: Single ovoid shaped gestational sac
within the endometrial canal.

Yolk sac:  Present.

Embryo:  Present.

Cardiac Activity: Present.

Heart Rate: 169 bpm

CRL:   22.8  mm   9 w 0 d                  US EDC: 02/09/2014

Maternal uterus/adnexae: No evidence of subchorionic hemorrhage. No
significant free fluid in the cul-de-sac. The sonographic appearance
of the ovaries is normal bilaterally.
IMPRESSION: 1. Single viable IUP with estimated gestational age of 9 weeks and 0
days and fetal heart rate of 169 beats per min. No acute findings.

## 2015-10-05 ENCOUNTER — Encounter (HOSPITAL_COMMUNITY): Payer: Self-pay | Admitting: *Deleted

## 2015-10-05 ENCOUNTER — Inpatient Hospital Stay (HOSPITAL_COMMUNITY)
Admission: AD | Admit: 2015-10-05 | Discharge: 2015-10-05 | Disposition: A | Discharge status: Home / Self Care | Payer: Self-pay | Source: Ambulatory Visit | Attending: Family Medicine | Admitting: Family Medicine

## 2015-10-05 DIAGNOSIS — Z3A01 Less than 8 weeks gestation of pregnancy: Secondary | ICD-10-CM | POA: Insufficient documentation

## 2015-10-05 DIAGNOSIS — R112 Nausea with vomiting, unspecified: Secondary | ICD-10-CM | POA: Insufficient documentation

## 2015-10-05 DIAGNOSIS — O26891 Other specified pregnancy related conditions, first trimester: Secondary | ICD-10-CM | POA: Insufficient documentation

## 2015-10-05 DIAGNOSIS — K219 Gastro-esophageal reflux disease without esophagitis: Secondary | ICD-10-CM | POA: Insufficient documentation

## 2015-10-05 DIAGNOSIS — O219 Vomiting of pregnancy, unspecified: Secondary | ICD-10-CM

## 2015-10-05 DIAGNOSIS — R1010 Upper abdominal pain, unspecified: Secondary | ICD-10-CM | POA: Insufficient documentation

## 2015-10-05 LAB — URINALYSIS, ROUTINE W REFLEX MICROSCOPIC
BILIRUBIN URINE: NEGATIVE
Glucose, UA: NEGATIVE mg/dL
Hgb urine dipstick: NEGATIVE
KETONES UR: NEGATIVE mg/dL
Leukocytes, UA: NEGATIVE
NITRITE: NEGATIVE
Protein, ur: NEGATIVE mg/dL
Specific Gravity, Urine: 1.025 (ref 1.005–1.030)
pH: 6 (ref 5.0–8.0)

## 2015-10-05 LAB — POCT PREGNANCY, URINE: PREG TEST UR: POSITIVE — AB

## 2015-10-05 MED ORDER — RANITIDINE HCL 150 MG PO TABS: 150.0000 mg | ORAL_TABLET | Freq: Two times a day (BID) | ORAL | Status: DC

## 2015-10-05 NOTE — MAU Note (Signed)
Got dizzy and became nauseated while at work.  Felt like couldn't catch her breath.  Does a lot of heavy lifting at work.  Had stomach pains, still having a little pain- is above the umbilicus

## 2015-10-05 NOTE — Discharge Instructions (Signed)
Food Choices for Gastroesophageal Reflux Disease, Adult When you have gastroesophageal reflux disease (GERD), the foods you eat and your eating habits are very important. Choosing the right foods can help ease the discomfort of GERD. WHAT GENERAL GUIDELINES DO I NEED TO FOLLOW?  Choose fruits, vegetables, whole grains, low-fat dairy products, and low-fat meat, fish, and poultry.  Limit fats such as oils, salad dressings, butter, nuts, and avocado.  Keep a food diary to identify foods that cause symptoms.  Avoid foods that cause reflux. These may be different for different people.  Eat frequent small meals instead of three large meals each day.  Eat your meals slowly, in a relaxed setting.  Limit fried foods.  Cook foods using methods other than frying.  Avoid drinking alcohol.  Avoid drinking large amounts of liquids with your meals.  Avoid bending over or lying down until 2-3 hours after eating. WHAT FOODS ARE NOT RECOMMENDED? The following are some foods and drinks that may worsen your symptoms: Vegetables Tomatoes. Tomato juice. Tomato and spaghetti sauce. Chili peppers. Onion and garlic. Horseradish. Fruits Oranges, grapefruit, and lemon (fruit and juice). Meats High-fat meats, fish, and poultry. This includes hot dogs, ribs, ham, sausage, salami, and bacon. Dairy Whole milk and chocolate milk. Sour cream. Cream. Butter. Ice cream. Cream cheese.  Beverages Coffee and tea, with or without caffeine. Carbonated beverages or energy drinks. Condiments Hot sauce. Barbecue sauce.  Sweets/Desserts Chocolate and cocoa. Donuts. Peppermint and spearmint. Fats and Oils High-fat foods, including French fries and potato chips. Other Vinegar. Strong spices, such as black pepper, white pepper, red pepper, cayenne, curry powder, cloves, ginger, and chili powder. The items listed above may not be a complete list of foods and beverages to avoid. Contact your dietitian for more  information.   This information is not intended to replace advice given to you by your health care provider. Make sure you discuss any questions you have with your health care provider.   Document Released: 10/07/2005 Document Revised: 10/28/2014 Document Reviewed: 08/11/2013 Elsevier Interactive Patient Education 2016 [employer]  

## 2015-10-05 NOTE — MAU Provider Note (Signed)
History     CSN: [id number]  Arrival date and time: 10/05/15 7829   First Provider Initiated Contact with Patient 10/05/15 1756      Chief Complaint  Patient presents with  . Nausea  . Dizziness  . Abdominal Pain   HPI   [name] is a 19 y.o. female [redacted]w[redacted]d at G2P1001 presenting to MAU with Upper abdominal pain. The upper abdominal pain started 1 week ago.   She is having mild upper abdominal pain; right above her belly button; she denies lower abdominal pain. She denies vaginal bleeding. The pain does not worsen after she eats. The pain worsens with movement and when she has to increase her movement, activity at work "they want me to work fast at work."  She is on her feet all day long.   [facility] is [facility] on [facility]  Patient is eating and drinking every 2-3 hours- last time she ate was 2 hours ago was a burger, fries and sprite.   Patient is requesting an [place] today to check on the pregnancy.   OB History    Gravida Para Term Preterm AB TAB SAB Ectopic Multiple Living   Past Medical History  Diagnosis Date  . Medical history non-contributory     Past Surgical History  Procedure Laterality Date  . No past surgeries      Family History  Problem Relation Age of Onset  . Diabetes Maternal Grandfather     Social History  Substance Use Topics  . Smoking status: Never Smoker   . Smokeless tobacco: Never Used  . Alcohol Use: No    Allergies: No Known Allergies  Prescriptions prior to admission  Medication Sig Dispense Refill Last Dose  . ondansetron (ZOFRAN) 8 MG tablet Take 1 tablet (8 mg total) by mouth every 8 (eight) hours as needed for nausea or vomiting. 21 tablet 0    Results for orders placed or performed during the hospital encounter of 10/05/15 (from the past 48 hour(s))  Urinalysis, Routine w reflex microscopic (not at [facility])     Status: None   Collection Time: 10/05/15  4:45 PM  Result  Value Ref Range   Color, Urine YELLOW YELLOW   APPearance CLEAR CLEAR   Specific Gravity, Urine 1.025 1.005 - 1.030   pH 6.0 5.0 - 8.0   Glucose, UA NEGATIVE NEGATIVE mg/dL   Hgb urine dipstick NEGATIVE NEGATIVE   Bilirubin Urine NEGATIVE NEGATIVE   Ketones, ur NEGATIVE NEGATIVE mg/dL   Protein, ur NEGATIVE NEGATIVE mg/dL   Nitrite NEGATIVE NEGATIVE   Leukocytes, UA NEGATIVE NEGATIVE    Comment: MICROSCOPIC NOT DONE ON URINES WITH NEGATIVE PROTEIN, BLOOD, LEUKOCYTES, NITRITE, OR GLUCOSE <1000 mg/dL.  Pregnancy, urine POC     Status: Abnormal   Collection Time: 10/05/15  5:01 PM  Result Value Ref Range   Preg Test, Ur POSITIVE (A) NEGATIVE    Comment:        THE SENSITIVITY OF THIS METHODOLOGY IS >24 mIU/mL     Review of Systems  Constitutional: Negative for fever and chills.  Gastrointestinal: Positive for nausea and abdominal pain (Upper abdominal pain ). Negative for vomiting.   Physical Exam   Blood pressure 117/55, pulse 74, temperature 98.1 F (36.7 C), temperature source Oral, resp. rate 16, height  (1.549 m), weight 150 lb (68.04 kg), last menstrual period 08/29/2015, currently  breastfeeding.  Physical Exam  Constitutional: She is oriented to person, place, and time. She appears well-developed and well-nourished. No distress.  GI: Soft. She exhibits no distension and no mass. There is no tenderness. There is no rebound and no guarding.  Musculoskeletal: Normal range of motion.  Neurological: She is alert and oriented to person, place, and time.  Skin: She is not diaphoretic.  Psychiatric: Her behavior is normal.    MAU Course  Procedures  None  MDM   Assessment and Plan   A:  1. Nausea/vomiting in pregnancy   2. Gastroesophageal reflux disease, esophagitis presence not specified    P:  Discharge home in stable condition Vitamin B6 and unisom  RX: Zantac  Keep appointment with the HD Outpatient US scheduled for viability: patient was upset that  she couldn't get an US today in MAU.  First trimester warning signs discussed    Duane LopeJennifer I Abdulaziz Toman, NP 10/05/2015 5:59 PM

## 2015-10-19 ENCOUNTER — Ambulatory Visit (HOSPITAL_COMMUNITY): Payer: No Typology Code available for payment source

## 2015-10-19 ENCOUNTER — Encounter: Payer: No Typology Code available for payment source | Admitting: Family Medicine

## 2015-10-22 HISTORY — PX: WISDOM TOOTH EXTRACTION: SHX21

## 2015-10-30 ENCOUNTER — Telehealth: Payer: Self-pay | Admitting: Family Medicine

## 2015-10-30 NOTE — Telephone Encounter (Signed)
Called patient regarding missed ultrasound and follow up in clinic. Left message for patient to return call. Patient returned call, informed her of missed appointments and rescheduled ultrasound and follow up in clinic 10/31/2015, also stressed the importance of keeping appointments.

## 2015-10-31 ENCOUNTER — Encounter (HOSPITAL_COMMUNITY): Payer: Self-pay | Admitting: Student

## 2015-10-31 ENCOUNTER — Ambulatory Visit (INDEPENDENT_AMBULATORY_CARE_PROVIDER_SITE_OTHER): Payer: Medicaid Other | Admitting: Student

## 2015-10-31 ENCOUNTER — Other Ambulatory Visit (HOSPITAL_COMMUNITY): Payer: Self-pay | Admitting: Obstetrics and Gynecology

## 2015-10-31 ENCOUNTER — Ambulatory Visit (HOSPITAL_COMMUNITY)
Admission: RE | Admit: 2015-10-31 | Discharge: 2015-10-31 | Disposition: A | Discharge status: Home / Self Care | Payer: Medicaid Other | Source: Ambulatory Visit | Attending: Obstetrics and Gynecology | Admitting: Obstetrics and Gynecology

## 2015-10-31 DIAGNOSIS — O219 Vomiting of pregnancy, unspecified: Secondary | ICD-10-CM | POA: Insufficient documentation

## 2015-10-31 DIAGNOSIS — Z3491 Encounter for supervision of normal pregnancy, unspecified, first trimester: Secondary | ICD-10-CM

## 2015-10-31 DIAGNOSIS — O468X1 Other antepartum hemorrhage, first trimester: Secondary | ICD-10-CM

## 2015-10-31 DIAGNOSIS — K219 Gastro-esophageal reflux disease without esophagitis: Secondary | ICD-10-CM

## 2015-10-31 DIAGNOSIS — O418X10 Other specified disorders of amniotic fluid and membranes, first trimester, not applicable or unspecified: Secondary | ICD-10-CM

## 2015-10-31 DIAGNOSIS — O43891 Other placental disorders, first trimester: Secondary | ICD-10-CM

## 2015-10-31 NOTE — Patient Instructions (Addendum)
First Trimester of Pregnancy The first trimester of pregnancy is from week 1 until the end of week 12 (months 1 through 3). A week after a sperm fertilizes an egg, the egg will implant on the wall of the uterus. This embryo will begin to develop into a baby. Genes from you and your partner are forming the baby. The female genes determine whether the baby is a boy or a girl. At 6-8 weeks, the eyes and face are formed, and the heartbeat can be seen on ultrasound. At the end of 12 weeks, all the baby's organs are formed.  Now that you are pregnant, you will want to do everything you can to have a healthy baby. Two of the most important things are to get good prenatal care and to follow your health care provider's instructions. Prenatal care is all the medical care you receive before the baby's birth. This care will help prevent, find, and treat any problems during the pregnancy and childbirth. BODY CHANGES Your body goes through many changes during pregnancy. The changes vary from woman to woman.   You may gain or lose a couple of pounds at first.  You may feel sick to your stomach (nauseous) and throw up (vomit). If the vomiting is uncontrollable, call your health care provider.  You may tire easily.  You may develop headaches that can be relieved by medicines approved by your health care provider.  You may urinate more often. Painful urination may mean you have a bladder infection.  You may develop heartburn as a result of your pregnancy.  You may develop constipation because certain hormones are causing the muscles that push waste through your intestines to slow down.  You may develop hemorrhoids or swollen, bulging veins (varicose veins).  Your breasts may begin to grow larger and become tender. Your nipples may stick out more, and the tissue that surrounds them (areola) may become darker.  Your gums may bleed and may be sensitive to brushing and flossing.  Dark spots or blotches (chloasma,  mask of pregnancy) may develop on your face. This will likely fade after the baby is born.  Your menstrual periods will stop.  You may have a loss of appetite.  You may develop cravings for certain kinds of food.  You may have changes in your emotions from day to day, such as being excited to be pregnant or being concerned that something may go wrong with the pregnancy and baby.  You may have more vivid and strange dreams.  You may have changes in your hair. These can include thickening of your hair, rapid growth, and changes in texture. Some women also have hair loss during or after pregnancy, or hair that feels dry or thin. Your hair will most likely return to normal after your baby is born. WHAT TO EXPECT AT YOUR PRENATAL VISITS During a routine prenatal visit:  You will be weighed to make sure you and the baby are growing normally.  Your blood pressure will be taken.  Your abdomen will be measured to track your baby's growth.  The fetal heartbeat will be listened to starting around week 10 or 12 of your pregnancy.  Test results from any previous visits will be discussed. Your health care provider may ask you:  How you are feeling.  If you are feeling the baby move.  If you have had any abnormal symptoms, such as leaking fluid, bleeding, severe headaches, or abdominal cramping.  If you are using any tobacco products,   including cigarettes, chewing tobacco, and electronic cigarettes.  If you have any questions. Other tests that may be performed during your first trimester include:  Blood tests to find your blood type and to check for the presence of any previous infections. They will also be used to check for low iron levels (anemia) and Rh antibodies. Later in the pregnancy, blood tests for diabetes will be done along with other tests if problems develop.  Urine tests to check for infections, diabetes, or protein in the urine.  An ultrasound to confirm the proper growth  and development of the baby.  An amniocentesis to check for possible genetic problems.  Fetal screens for spina bifida and Down syndrome.  You may need other tests to make sure you and the baby are doing well.  HIV (human immunodeficiency virus) testing. Routine prenatal testing includes screening for HIV, unless you choose not to have this test. HOME CARE INSTRUCTIONS  Medicines  Follow your health care provider's instructions regarding medicine use. Specific medicines may be either safe or unsafe to take during pregnancy.  Take your prenatal vitamins as directed.  If you develop constipation, try taking a stool softener if your health care provider approves. Diet  Eat regular, well-balanced meals. Choose a variety of foods, such as meat or vegetable-based protein, fish, milk and low-fat dairy products, vegetables, fruits, and whole grain breads and cereals. Your health care provider will help you determine the amount of weight gain that is right for you.  Avoid raw meat and uncooked cheese. These carry germs that can cause birth defects in the baby.  Eating four or five small meals rather than three large meals a day may help relieve nausea and vomiting. If you start to feel nauseous, eating a few soda crackers can be helpful. Drinking liquids between meals instead of during meals also seems to help nausea and vomiting.  If you develop constipation, eat more high-fiber foods, such as fresh vegetables or fruit and whole grains. Drink enough fluids to keep your urine clear or pale yellow. Activity and Exercise  Exercise only as directed by your health care provider. Exercising will help you:  Control your weight.  Stay in shape.  Be prepared for labor and delivery.  Experiencing pain or cramping in the lower abdomen or low back is a good sign that you should stop exercising. Check with your health care provider before continuing normal exercises.  Try to avoid standing for long  periods of time. Move your legs often if you must stand in one place for a long time.  Avoid heavy lifting.  Wear low-heeled shoes, and practice good posture.  You may continue to have sex unless your health care provider directs you otherwise. Relief of Pain or Discomfort  Wear a good support bra for breast tenderness.   Take warm sitz baths to soothe any pain or discomfort caused by hemorrhoids. Use hemorrhoid cream if your health care provider approves.   Rest with your legs elevated if you have leg cramps or low back pain.  If you develop varicose veins in your legs, wear support hose. Elevate your feet for 15 minutes, 3-4 times a day. Limit salt in your diet. Prenatal Care  Schedule your prenatal visits by the twelfth week of pregnancy. They are usually scheduled monthly at first, then more often in the last 2 months before delivery.  Write down your questions. Take them to your prenatal visits.  Keep all your prenatal visits as directed by your   health care provider. Safety  Wear your seat belt at all times when driving.  Make a list of emergency phone numbers, including numbers for family, friends, the hospital, and police and fire departments. General Tips  Ask your health care provider for a referral to a local prenatal education class. Begin classes no later than at the beginning of month 6 of your pregnancy.  Ask for help if you have counseling or nutritional needs during pregnancy. Your health care provider can offer advice or refer you to specialists for help with various needs.  Do not use hot tubs, steam rooms, or saunas.  Do not douche or use tampons or scented sanitary pads.  Do not cross your legs for long periods of time.  Avoid cat litter boxes and soil used by cats. These carry germs that can cause birth defects in the baby and possibly loss of the fetus by miscarriage or stillbirth.  Avoid all smoking, herbs, alcohol, and medicines not prescribed by  your health care provider. Chemicals in these affect the formation and growth of the baby.  Do not use any tobacco products, including cigarettes, chewing tobacco, and electronic cigarettes. If you need help quitting, ask your health care provider. You may receive counseling support and other resources to help you quit.  Schedule a dentist appointment. At home, brush your teeth with a soft toothbrush and be gentle when you floss. SEEK MEDICAL CARE IF:   You have dizziness.  You have mild pelvic cramps, pelvic pressure, or nagging pain in the abdominal area.  You have persistent nausea, vomiting, or diarrhea.  You have a bad smelling vaginal discharge.  You have pain with urination.  You notice increased swelling in your face, hands, legs, or ankles. SEEK IMMEDIATE MEDICAL CARE IF:   You have a fever.  You are leaking fluid from your vagina.  You have spotting or bleeding from your vagina.  You have severe abdominal cramping or pain.  You have rapid weight gain or loss.  You vomit blood or material that looks like coffee grounds.  You are exposed to [place] measles and have never had them.  You are exposed to fifth disease or chickenpox.  You develop a severe headache.  You have shortness of breath.  You have any kind of trauma, such as from a fall or a car accident.   This information is not intended to replace advice given to you by your health care provider. Make sure you discuss any questions you have with your health care provider.   Document Released: 10/01/2001 Document Revised: 10/28/2014 Document Reviewed: 08/17/2013 Elsevier Interactive Patient Education 2016 [employer]vier Inc.   Subchorionic Hematoma A subchorionic hematoma is a gathering of blood between the outer wall of the placenta and the inner wall of the womb (uterus). The placenta is the organ that connects the fetus to the wall of the uterus. The placenta performs the feeding, breathing (oxygen to the  fetus), and waste removal (excretory work) of the fetus.  Subchorionic hematoma is the most common abnormality found on a result from ultrasonography done during the first trimester or early second trimester of pregnancy. If there has been little or no vaginal bleeding, early small hematomas usually shrink on their own and do not affect your baby or pregnancy. The blood is gradually absorbed over 1-2 weeks. When bleeding starts later in pregnancy or the hematoma is larger or occurs in an older pregnant woman, the outcome may not be as good. Larger hematomas may get  bigger, which increases the chances for miscarriage. Subchorionic hematoma also increases the risk of premature detachment of the placenta from the uterus, preterm (premature) labor, and stillbirth. HOME CARE INSTRUCTIONS  Stay on bed rest if your health care provider recommends this. Although bed rest will not prevent more bleeding or prevent a miscarriage, your health care provider may recommend bed rest until you are advised otherwise.  Avoid heavy lifting (more than 10 lb [4.5 kg]), exercise, sexual intercourse, or douching as directed by your health care provider.  Keep track of the number of pads you use each day and how soaked (saturated) they are. Write down this information.  Do not use tampons.  Keep all follow-up appointments as directed by your health care provider. Your health care provider may ask you to have follow-up blood tests or ultrasound tests or both. SEEK IMMEDIATE MEDICAL CARE IF:  You have severe cramps in your stomach, back, abdomen, or pelvis.  You have a fever.  You pass large clots or tissue. Save any tissue for your health care provider to look at.  Your bleeding increases or you become lightheaded, feel weak, or have fainting episodes.   This information is not intended to replace advice given to you by your health care provider. Make sure you discuss any questions you have with your health care  provider.   Document Released: 01/22/2007 Document Revised: 10/28/2014 Document Reviewed: 05/06/2013 Elsevier Interactive Patient Education 2016 [employer]     Safe Medications in Pregnancy   Acne: Benzoyl Peroxide Salicylic Acid  Backache/Headache: Tylenol: 2 regular strength every 4 hours OR              2 Extra strength every 6 hours  Colds/Coughs/Allergies: Benadryl (alcohol free) 25 mg every 6 hours as needed Breath right strips Claritin Cepacol throat lozenges Chloraseptic throat spray Cold-Eeze- up to three times per day Cough drops, alcohol free Flonase (by prescription only) Guaifenesin Mucinex Robitussin DM (plain only, alcohol free) Saline nasal spray/drops Sudafed (pseudoephedrine) & Actifed ** use only after [redacted] weeks gestation and if you do not have high blood pressure Tylenol Vicks Vaporub Zinc lozenges Zyrtec   Constipation: Colace Ducolax suppositories Fleet enema Glycerin suppositories Metamucil Milk of magnesia Miralax Senokot Smooth move tea  Diarrhea: Kaopectate Imodium A-D  *NO pepto Bismol  Hemorrhoids: Anusol Anusol HC Preparation H Tucks  Indigestion: Tums Maalox Mylanta Zantac  Pepcid  Insomnia: Benadryl (alcohol free) 25mg  every 6 hours as needed Tylenol PM Unisom, no Gelcaps  Leg Cramps: Tums MagGel  Nausea/Vomiting:  Bonine Dramamine Emetrol Ginger extract Sea bands Meclizine  Nausea medication to take during pregnancy:  Unisom (doxylamine succinate 25 mg tablets) Take one tablet daily at bedtime. If symptoms are not adequately controlled, the dose can be increased to a maximum recommended dose of two tablets daily (1/2 tablet in the morning, 1/2 tablet mid-afternoon and one at bedtime). Vitamin B6 100mg  tablets. Take one tablet twice a day (up to 200 mg per day).  Skin Rashes: Aveeno products Benadryl cream or 25mg  every 6 hours as needed Calamine Lotion 1% cortisone cream  Yeast  infection: Gyne-lotrimin 7 Monistat 7  Gum/tooth pain: Anbesol  **If taking multiple medications, please check labels to avoid duplicating the same active ingredients **take medication as directed on the label ** Do not exceed 4000 mg of tylenol in 24 hours **Do not take medications that contain aspirin or ibuprofen    [facility]   Francoise CeoBernard Marshall  Phone: [phone]  [facility]     Phone: [phone]  [facility] [place]  Phone: [phone]  [facility] for [facility] [place]  Phone: [phone]  [facility]    Phone: [phone]   [facility])    Phone: [phone]  [facility]    Phone: [phone]  [facility]    Phone: [phone]  [facility]    Phone: [phone]  [facility] Phone: [phone]   [facility]  Phone: [phone]  [facility]    Phone: [phone]  [facility] [place]   Phone: [phone]  [facility]      Phone: [phone]  [facility]    Phone: [phone]  [facility]     Phone: [phone]

## 2015-10-31 NOTE — Progress Notes (Signed)
History   [id number]  HPI Ruth Lin is a 20 y.o. female  G2P1001 here with for follow-up ultrasound.  Upon review of the records patient was first seen on 12/15 for upper abdominal pain & nausea. BHCG was not collected on that day. Outpatient ultrasound was ordered for viability. Pt here today with no report of abdominal pain or vaginal bleeding. All other systems wnl.   Patient's last menstrual period was 08/29/2015.  OB History  Gravida Para Term Preterm AB SAB TAB Ectopic Multiple Living  2 1 1       1     # Outcome Date GA Lbr Len/2nd Weight Sex Delivery Anes PTL Lv  2 Current           1 Term 02/08/14 [id number] 10:42 / 00:17 7 lb 2.1 oz (3.235 kg) M Vag-Spont EPI  Y      Past Medical History  Diagnosis Date  . Medical history non-contributory     Family History  Problem Relation Age of Onset  . Diabetes Maternal Grandfather     Social History   Social History  . Marital Status: Married    Spouse Name: N/A  . Number of Children: N/A  . Years of Education: N/A   Social History Main Topics  . Smoking status: Never Smoker   . Smokeless tobacco: Never Used  . Alcohol Use: No  . Drug Use: No  . Sexual Activity: Yes    Birth Control/ Protection: None   Other Topics Concern  . Not on file   Social History Narrative    No Known Allergies  Current Outpatient Prescriptions on File Prior to Visit  Medication Sig Dispense Refill  . ranitidine (ZANTAC) 150 MG tablet Take 1 tablet (150 mg total) by mouth 2 (two) times daily. 60 tablet 0   No current facility-administered medications on file prior to visit.     Physical Exam   There were no vitals filed for this visit.  Physical Exam  Constitutional: She is oriented to person, place, and time. She appears well-developed and well-nourished. No distress.  Respiratory: Effort normal. No respiratory distress.  Musculoskeletal: Normal range of motion.  Neurological: She is alert and oriented to person, place, and  time.  Skin: She is not diaphoretic.  Psychiatric: She has a normal mood and affect. Her behavior is normal. Judgment and thought content normal.    MAU Course  Procedures [place] Ob Comp Less 14 Wks  10/31/2015  CLINICAL DATA:  Abdominal pain and vaginal bleeding in first trimester pregnancy. nausea and vomiting. Unsure of LMP. EXAM: OBSTETRIC <14 WK US AND TRANSVAGINAL OB US TECHNIQUE: Both transabdominal and transvaginal ultrasound examinations were performed for complete evaluation of the gestation as well as the maternal uterus, adnexal regions, and pelvic cul-de-sac. Transvaginal technique was performed to assess early pregnancy. COMPARISON:  None. FINDINGS: Intrauterine gestational sac: Visualized/normal in shape. Yolk sac:  Visualized Embryo:  Visualized Cardiac Activity: Visualized Heart Rate: 158  bpm CRL:  16  mm   8 w   0 d                  US EDC: 06/11/2016 Subchorionic hemorrhage:  None visualized. Maternal uterus/adnexae: Retroverted uterus. Both ovaries are unremarkable in appearance. No mass or abnormal free fluid visualized. IMPRESSION: Single living IUP measuring 8 weeks 0 days with US EDC of 06/11/2016. No significant maternal uterine or adnexal abnormality identified. Electronically Signed   By: Myles RosenthalJohn  Lin M.D.   On: 10/31/2015 15:39  US Ob Transvaginal  10/31/2015  CLINICAL DATA:  Abdominal pain and vaginal bleeding in first trimester pregnancy. nausea and vomiting. Unsure of LMP. EXAM: OBSTETRIC <14 WK [place] AND TRANSVAGINAL OB US TECHNIQUE: Both transabdominal and transvaginal ultrasound examinations were performed for complete evaluation of the gestation as well as the maternal uterus, adnexal regions, and pelvic cul-de-sac. Transvaginal technique was performed to assess early pregnancy. COMPARISON:  None. FINDINGS: Intrauterine gestational sac: Visualized/normal in shape. Yolk sac:  Visualized Embryo:  Visualized Cardiac Activity: Visualized Heart Rate: 158  bpm CRL:  16  mm   8 w   0  d                  [place] EDC: 06/11/2016 Subchorionic hemorrhage:  None visualized. Maternal uterus/adnexae: Retroverted uterus. Both ovaries are unremarkable in appearance. No mass or abnormal free fluid visualized. IMPRESSION: Single living IUP measuring 8 weeks 0 days with [place] EDC of 06/11/2016. No significant maternal uterine or adnexal abnormality identified. Electronically Signed   By: Myles Rosenthal M.D.   On: 10/31/2015 15:39    MDM IUP with cardiac activity [facility] Plans on getting care with Health Dept  Assessment and Plan  19 y.o. G2P1001 at [redacted]w[redacted]d IUP  1. Normal IUP (intrauterine pregnancy) on prenatal ultrasound, first trimester   2. Subchorionic hematoma in first trimester    P: Start [facility] Pregnancy verification letter given  Judeth Horn, NP 10/31/2015 3:24 PM

## 2015-11-08 ENCOUNTER — Other Ambulatory Visit (HOSPITAL_COMMUNITY): Payer: Self-pay | Admitting: Nurse Practitioner

## 2015-11-08 DIAGNOSIS — Z3682 Encounter for antenatal screening for nuchal translucency: Secondary | ICD-10-CM

## 2015-12-05 ENCOUNTER — Ambulatory Visit (HOSPITAL_COMMUNITY)
Admission: RE | Admit: 2015-12-05 | Discharge: 2015-12-05 | Disposition: A | Discharge status: Home / Self Care | Payer: Medicaid Other | Source: Ambulatory Visit | Attending: Nurse Practitioner | Admitting: Nurse Practitioner

## 2015-12-05 ENCOUNTER — Other Ambulatory Visit (HOSPITAL_COMMUNITY): Payer: Self-pay | Admitting: Nurse Practitioner

## 2015-12-05 ENCOUNTER — Encounter (HOSPITAL_COMMUNITY): Payer: Self-pay

## 2015-12-05 DIAGNOSIS — Z3682 Encounter for antenatal screening for nuchal translucency: Secondary | ICD-10-CM

## 2015-12-05 DIAGNOSIS — Z3A13 13 weeks gestation of pregnancy: Secondary | ICD-10-CM

## 2015-12-05 DIAGNOSIS — Z8279 Family history of other congenital malformations, deformations and chromosomal abnormalities: Secondary | ICD-10-CM

## 2015-12-05 DIAGNOSIS — Z36 Encounter for antenatal screening of mother: Secondary | ICD-10-CM | POA: Insufficient documentation

## 2015-12-29 ENCOUNTER — Other Ambulatory Visit (HOSPITAL_COMMUNITY): Payer: Self-pay

## 2016-01-15 IMAGING — US US OB FOLLOW-UP
1 series · 12 of 28 positions shown · non-contrast
Comparison: none

[Series 1: us ob follow-up · 12 of 47 slices shown]
[im 2/47]
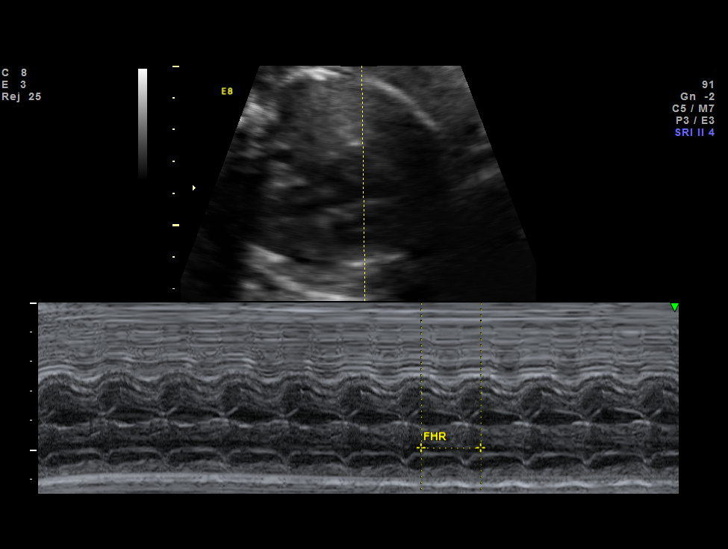
[im 6/47]
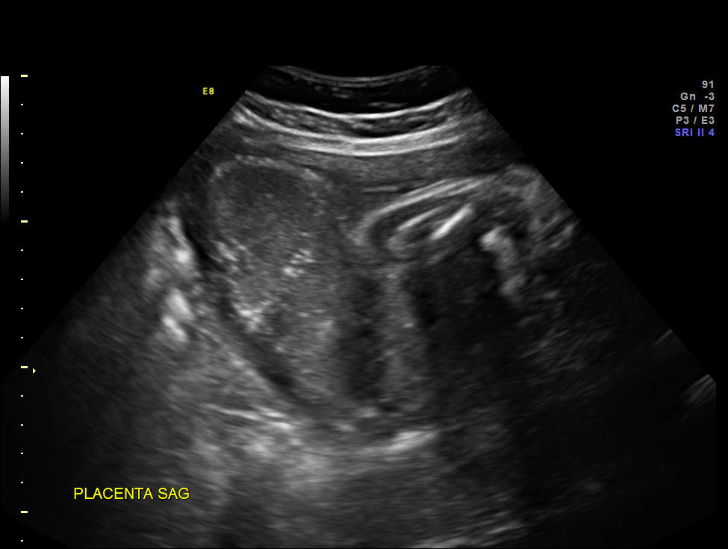
[im 9/47]
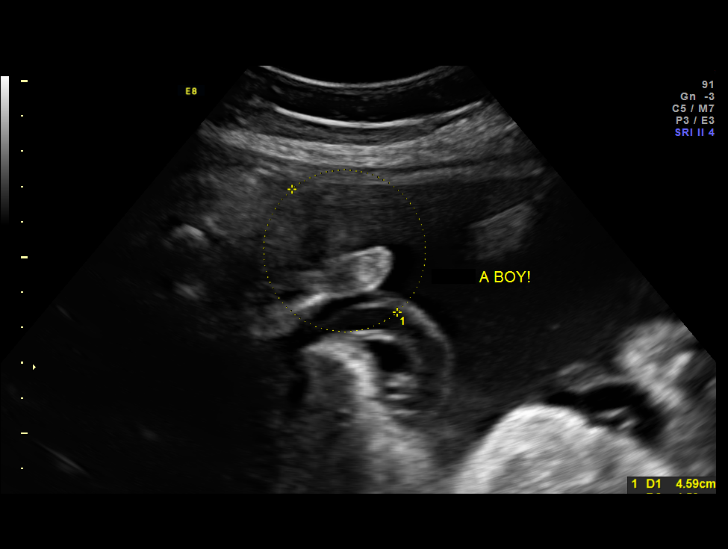
[im 14/47]
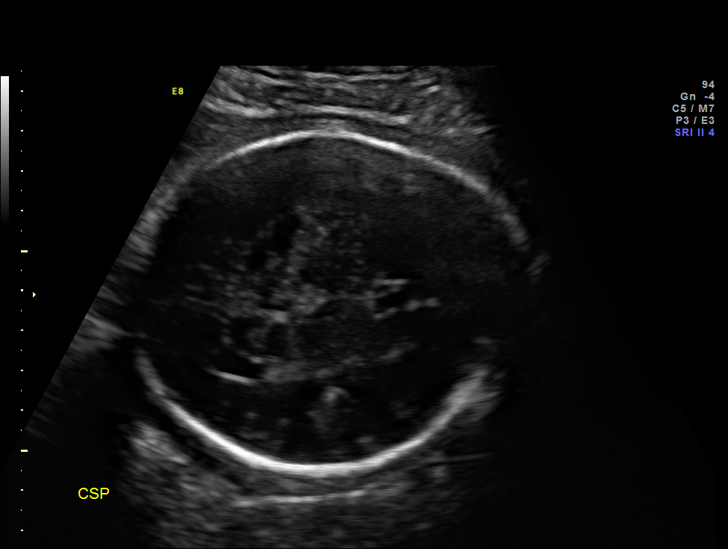
[im 18/47]
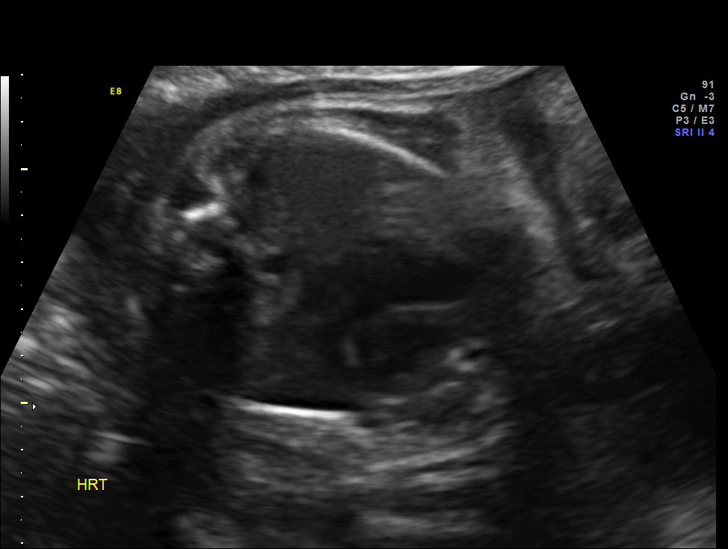
[im 21/47]
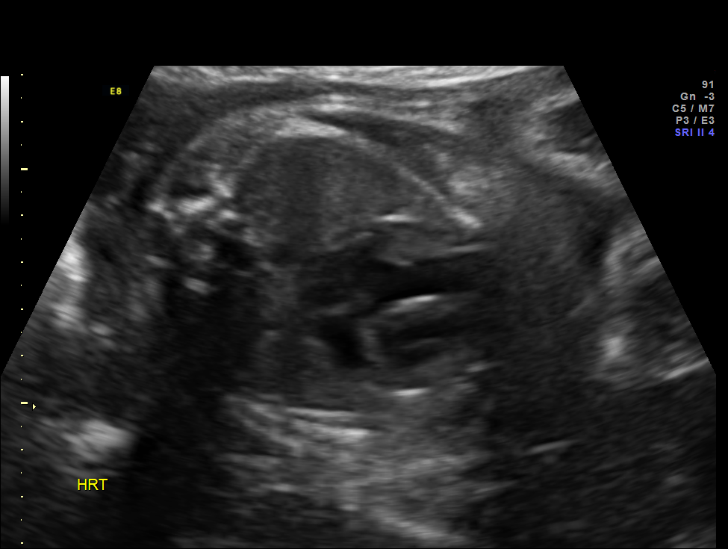
[im 26/47]
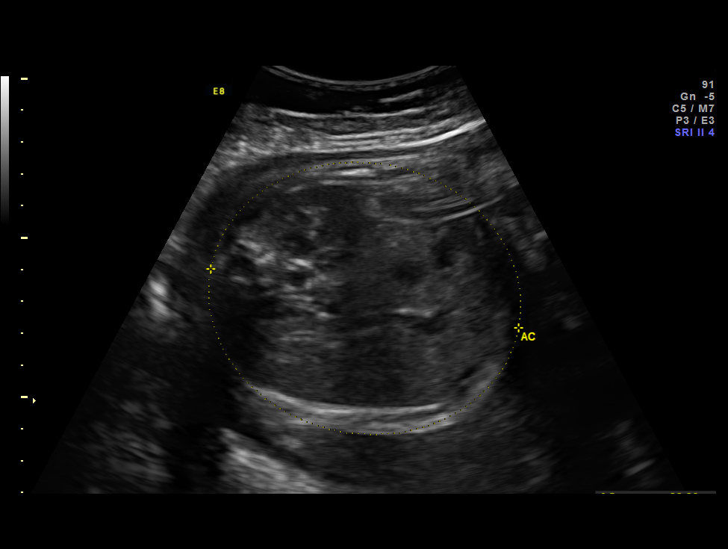
[im 29/47]
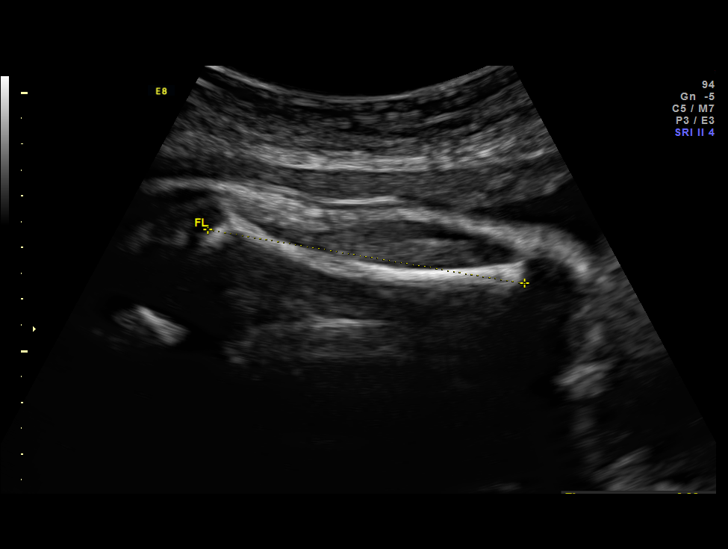
[im 33/47]
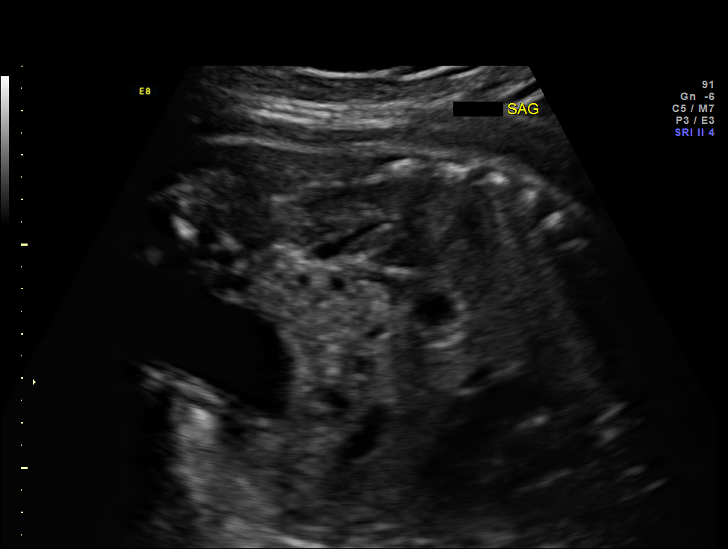
[im 38/47]
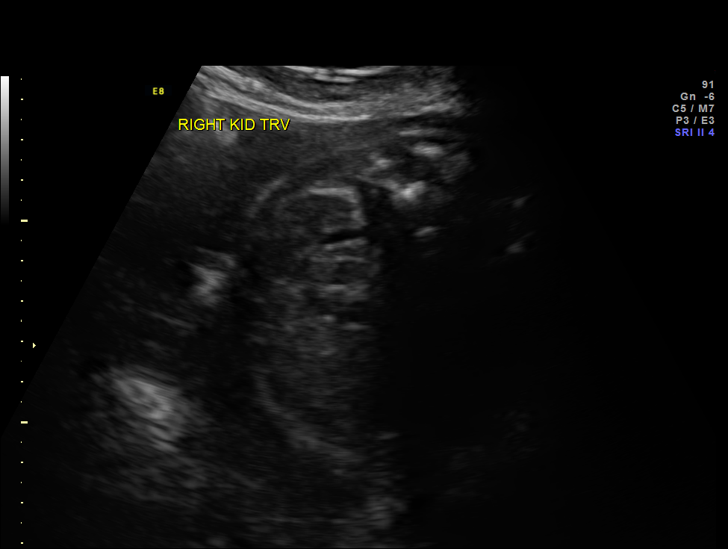
[im 41/47]
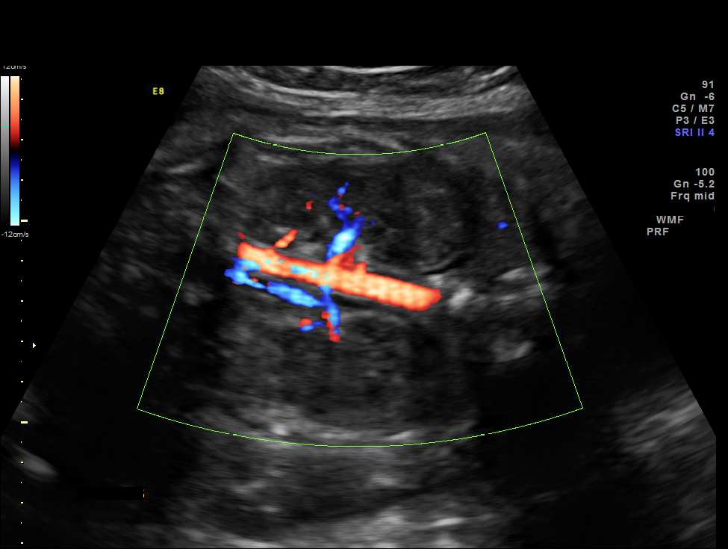
[im 45/47]
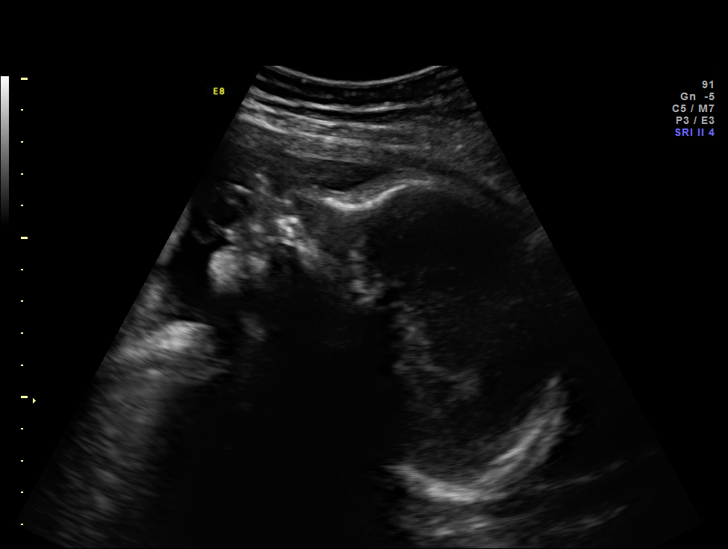

[12 of 28 positions shown; findings below may reference images not displayed]

OBSTETRICS REPORT
                      (Signed Final 12/30/2013 [DATE])

Service(s) Provided

 US OB FOLLOW UP                                       76816.1
Indications

 (Left) Pyelectasis of fetus on prenatal ultrasound    655.83 593.89,

 History of genetic / anatomic abnormality -
 unilateral renal agensis (FOB)
Fetal Evaluation

 Num Of Fetuses:    1
 Fetal Heart Rate:  169                          bpm
 Cardiac Activity:  Observed
 Presentation:      Cephalic
 Placenta:          Posterior, above cervical
                    os
 P. Cord            Previously Visualized
 Insertion:

 Amniotic Fluid
 AFI FV:      Subjectively within normal limits
 AFI Sum:     11.3    cm       29  %Tile     Larg Pckt:    6.15  cm
 RUQ:   6.15    cm   RLQ:    1.1    cm    LUQ:   2.36    cm   LLQ:    1.69   cm
Biometry

 BPD:     81.7  mm     G. Age:  32w 6d                CI:         76.6   70 - 86
 OFD:    106.6  mm                                    FL/HC:      20.6   19.4 -

 HC:     302.3  mm     G. Age:  33w 4d        9  %    HC/AC:      1.04   0.96 -

 AC:     290.6  mm     G. Age:  33w 0d       24  %    FL/BPD:     76.3   71 - 87
 FL:      62.3  mm     G. Age:  32w 2d        7  %    FL/AC:      21.4   20 - 24
 HUM:     56.7  mm     G. Age:  33w 0d       34  %

 Est. FW:    4155  gm      4 lb 9 oz     35  %
Gestational Age

 LMP:           35w 2d        Date:  04/27/13                 EDD:   02/01/14
 U/S Today:     33w 0d                                        EDD:   02/17/14
 Best:          34w 1d     Det. By:  Early Ultrasound         EDD:   02/09/14
                                     (07/07/13)
Anatomy

 Cranium:          Appears normal         Aortic Arch:      Previously seen
 Fetal Cavum:      Appears normal         Ductal Arch:      Not well visualized
 Ventricles:       Appears normal         Diaphragm:        Previously seen
 Choroid Plexus:   Previously seen        Stomach:          Appears normal, left
                                                            sided
 Cerebellum:       Previously seen        Abdomen:          Appears normal
 Posterior Fossa:  Previously seen        Abdominal Wall:   Previously seen
 Nuchal Fold:      Not applicable (>20    Cord Vessels:     Previously seen
                   wks GA)
 Face:             Orbits and profile     Kidneys:          Appear normal
                   previously seen
 Lips:             Previously seen        Bladder:          Appears normal
 Heart:            Appears normal         Spine:            Previously seen
                   (4CH, axis, and
                   situs)
 RVOT:             Previously seen        Lower             Previously seen
                                          Extremities:
 LVOT:             Previously seen        Upper             Previously seen
                                          Extremities:

 Other:  Male gender. Technically difficult due to advanced GA and fetal
         position.
Cervix Uterus Adnexa

 Cervix:       Normal appearance by transabdominal scan.

 Left Ovary:    Within normal limits.
 Right Ovary:   Within normal limits.
 Adnexa:     No abnormality visualized.
Impression

 Single IUP at 34 [DATE] weeks
 Interval growth is appropriate (35th %tile)
 The previously noted renal pylectasis appears to have
 resolved
 Normal interval anatomy
 Normal amniotic fluid volume
Recommendations

 Follow-up ultrasounds as clinically indicated.

 questions or concerns.

## 2016-04-21 ENCOUNTER — Inpatient Hospital Stay (HOSPITAL_COMMUNITY)
Admission: AD | Admit: 2016-04-21 | Discharge: 2016-04-21 | Disposition: A | Discharge status: Home / Self Care | Payer: Medicaid Other | Source: Ambulatory Visit | Attending: Obstetrics & Gynecology | Admitting: Obstetrics & Gynecology

## 2016-04-21 ENCOUNTER — Encounter (HOSPITAL_COMMUNITY): Payer: Self-pay | Admitting: *Deleted

## 2016-04-21 DIAGNOSIS — Z8719 Personal history of other diseases of the digestive system: Secondary | ICD-10-CM | POA: Diagnosis not present

## 2016-04-21 DIAGNOSIS — Z3A32 32 weeks gestation of pregnancy: Secondary | ICD-10-CM | POA: Diagnosis not present

## 2016-04-21 DIAGNOSIS — O26899 Other specified pregnancy related conditions, unspecified trimester: Secondary | ICD-10-CM

## 2016-04-21 DIAGNOSIS — R109 Unspecified abdominal pain: Secondary | ICD-10-CM | POA: Diagnosis not present

## 2016-04-21 DIAGNOSIS — O26893 Other specified pregnancy related conditions, third trimester: Secondary | ICD-10-CM | POA: Diagnosis not present

## 2016-04-21 DIAGNOSIS — R102 Pelvic and perineal pain: Secondary | ICD-10-CM | POA: Diagnosis not present

## 2016-04-21 DIAGNOSIS — O9989 Other specified diseases and conditions complicating pregnancy, childbirth and the puerperium: Secondary | ICD-10-CM

## 2016-04-21 HISTORY — DX: Unspecified hemorrhoids: K64.9

## 2016-04-21 LAB — URINALYSIS, ROUTINE W REFLEX MICROSCOPIC
Bilirubin Urine: NEGATIVE
Glucose, UA: NEGATIVE mg/dL
Hgb urine dipstick: NEGATIVE
Ketones, ur: NEGATIVE mg/dL
LEUKOCYTES UA: NEGATIVE
NITRITE: NEGATIVE
Protein, ur: NEGATIVE mg/dL
Specific Gravity, Urine: 1.015 (ref 1.005–1.030)
pH: 7 (ref 5.0–8.0)

## 2016-04-21 MED ORDER — CYCLOBENZAPRINE HCL 10 MG PO TABS
10.0000 mg | ORAL_TABLET | Freq: Once | ORAL | Status: AC
  Administered 2016-04-21: 10 mg via ORAL
  Filled 2016-04-21: qty 1

## 2016-04-21 MED ORDER — CYCLOBENZAPRINE HCL 10 MG PO TABS: 10.0000 mg | ORAL_TABLET | Freq: Three times a day (TID) | ORAL | Status: DC | PRN

## 2016-04-21 NOTE — MAU Note (Signed)
C/o increased pelvic pain "its hard to walk"; denies vaginal bleeding or leaking; c/o intermittent ucs for past month;

## 2016-04-21 NOTE — Discharge Instructions (Signed)
Abdominal Pain During Pregnancy °Abdominal pain is common in pregnancy. Most of the time, it does not cause harm. There are many causes of abdominal pain. Some causes are more serious than others. Some of the causes of abdominal pain in pregnancy are easily diagnosed. Occasionally, the diagnosis takes time to understand. Other times, the cause is not determined. Abdominal pain can be a sign that something is very wrong with the pregnancy, or the pain may have nothing to do with the pregnancy at all. For this reason, always tell your health care provider if you have any abdominal discomfort. °HOME CARE INSTRUCTIONS  °Monitor your abdominal pain for any changes. The following actions may help to alleviate any discomfort you are experiencing: °· Do not have sexual intercourse or put anything in your vagina until your symptoms go away completely. °· Get plenty of rest until your pain improves. °· Drink clear fluids if you feel nauseous. Avoid solid food as long as you are uncomfortable or nauseous. °· Only take over-the-counter or prescription medicine as directed by your health care provider. °· Keep all follow-up appointments with your health care provider. °SEEK IMMEDIATE MEDICAL CARE IF: °· You are bleeding, leaking fluid, or passing tissue from the vagina. °· You have increasing pain or cramping. °· You have persistent vomiting. °· You have painful or bloody urination. °· You have a fever. °· You notice a decrease in your baby's movements. °· You have extreme weakness or feel faint. °· You have shortness of breath, with or without abdominal pain. °· You develop a severe headache with abdominal pain. °· You have abnormal vaginal discharge with abdominal pain. °· You have persistent diarrhea. °· You have abdominal pain that continues even after rest, or gets worse. °MAKE SURE YOU:  °· Understand these instructions. °· Will watch your condition. °· Will get help right away if you are not doing well or get worse. °    °This information is not intended to replace advice given to you by your health care provider. Make sure you discuss any questions you have with your health care provider. °  °Document Released: 10/07/2005 Document Revised: 07/28/2013 Document Reviewed: 05/06/2013 °Elsevier Interactive Patient Education ©2016 [employer] ° °Round Ligament Pain °The round ligament is a cord of muscle and tissue that helps to support the uterus. It can become a source of pain during pregnancy if it becomes stretched or twisted as the baby grows. The pain usually begins in the second trimester of pregnancy, and it can come and go until the baby is delivered. It is not a serious problem, and it does not cause harm to the baby. °Round ligament pain is usually a short, sharp, and pinching pain, but it can also be a dull, lingering, and aching pain. The pain is felt in the lower side of the abdomen or in the groin. It usually starts deep in the groin and moves up to the outside of the hip area. Pain can occur with: °· A sudden change in position. °· Rolling over in bed. °· Coughing or sneezing. °· Physical activity. °HOME CARE INSTRUCTIONS °Watch your condition for any changes. Take these steps to help with your pain: °· When the pain starts, relax. Then try: °¨ Sitting down. °¨ Flexing your knees up to your abdomen. °¨ Lying on your side with one pillow under your abdomen and another pillow between your legs. °¨ Sitting in a warm bath for 15-20 minutes or until the pain goes away. °· Take over-the-counter   and prescription medicines only as told by your health care provider. °· Move slowly when you sit and stand. °· Avoid long walks if they cause pain. °· Stop or lessen your physical activities if they cause pain. °SEEK MEDICAL CARE IF: °· Your pain does not go away with treatment. °· You feel pain in your back that you did not have before. °· Your medicine is not helping. °SEEK IMMEDIATE MEDICAL CARE IF: °· You develop a fever or  chills. °· You develop uterine contractions. °· You develop vaginal bleeding. °· You develop nausea or vomiting. °· You develop diarrhea. °· You have pain when you urinate. °  °This information is not intended to replace advice given to you by your health care provider. Make sure you discuss any questions you have with your health care provider. °  °Document Released: 07/16/2008 Document Revised: 12/30/2011 Document Reviewed: 12/14/2014 °Elsevier Interactive Patient Education ©2016 Elsevier Inc. ° °

## 2016-04-21 NOTE — MAU Provider Note (Signed)
History   G2P1001 @ 32.5 wks in with progressive pelvic pressure over the past few days. Denies any other complaints. Good fetal movement.  CSN: [id number]  Arrival date & time 04/21/16  1028   First Provider Initiated Contact with Patient 04/21/16 1123      Chief Complaint  Patient presents with  . Pelvic Pain    HPI  Past Medical History  Diagnosis Date  . Medical history non-contributory   . Hemorrhoid     Past Surgical History  Procedure Laterality Date  . No past surgeries      Family History  Problem Relation Age of Onset  . Diabetes Maternal Grandfather     Social History  Substance Use Topics  . Smoking status: Never Smoker   . Smokeless tobacco: Never Used  . Alcohol Use: No    OB History    Gravida Para Term Preterm AB TAB SAB Ectopic Multiple Living   2 1 1       1       Review of Systems  Constitutional: Negative.   HENT: Negative.   Eyes: Negative.   Respiratory: Negative.   Cardiovascular: Negative.   Gastrointestinal: Positive for abdominal pain.  Endocrine: Negative.   Genitourinary: Negative.   Musculoskeletal: Negative.   Skin: Negative.   Allergic/Immunologic: Negative.   Neurological: Negative.   Hematological: Negative.   Psychiatric/Behavioral: Negative.     Allergies  Review of patient's allergies indicates no known allergies.  Home Medications  No current outpatient prescriptions on file.  BP 115/62 mmHg  Pulse 98  Temp(Src) 98.4 F (36.9 C) (Oral)  Resp 16  LMP 08/29/2015  Physical Exam  Constitutional: She is oriented to person, place, and time. She appears well-developed and well-nourished.  HENT:  Head: Normocephalic.  Eyes: Pupils are equal, round, and reactive to light.  Neck: Normal range of motion.  Cardiovascular: Normal rate, regular rhythm, normal heart sounds and intact distal pulses.   Pulmonary/Chest: Effort normal and breath sounds normal.  Abdominal: Soft. Bowel sounds are normal.  Genitourinary:  Vagina normal and uterus normal.  Musculoskeletal: Normal range of motion.  Neurological: She is alert and oriented to person, place, and time. She has normal reflexes.  Skin: Skin is warm and dry.  Psychiatric: She has a normal mood and affect. Her behavior is normal. Thought content normal.    MAU Course  Procedures (including critical care time)  Labs Reviewed  URINALYSIS, ROUTINE W REFLEX MICROSCOPIC (NOT AT [facility])   No results found.   No diagnosis found.    MDM  SVE cl/th/post/high. No uc's, FHR pattern reassurring. Will d/c home on flexiril with neg urine

## 2016-05-27 ENCOUNTER — Inpatient Hospital Stay (HOSPITAL_COMMUNITY)
Admission: AD | Admit: 2016-05-27 | Discharge: 2016-05-27 | Disposition: A | Discharge status: Home / Self Care | Payer: Medicaid Other | Source: Ambulatory Visit | Attending: Obstetrics & Gynecology | Admitting: Obstetrics & Gynecology

## 2016-05-27 ENCOUNTER — Encounter (HOSPITAL_COMMUNITY): Payer: Self-pay

## 2016-05-27 DIAGNOSIS — O471 False labor at or after 37 completed weeks of gestation: Secondary | ICD-10-CM | POA: Diagnosis present

## 2016-05-27 DIAGNOSIS — Z3A37 37 weeks gestation of pregnancy: Secondary | ICD-10-CM | POA: Insufficient documentation

## 2016-05-27 HISTORY — DX: Anemia, unspecified: D64.9

## 2016-05-27 NOTE — MAU Note (Signed)
Pt reports really bad pelvic pain and pressure x one week, worsening today. Also reports contractions.

## 2016-05-29 LAB — CULTURE, BETA STREP (GROUP B ONLY)

## 2016-06-10 ENCOUNTER — Encounter (HOSPITAL_COMMUNITY): Payer: Self-pay | Admitting: *Deleted

## 2016-06-10 ENCOUNTER — Inpatient Hospital Stay (HOSPITAL_COMMUNITY)
Admission: AD | Admit: 2016-06-10 | Discharge: 2016-06-11 | Disposition: A | Discharge status: Home / Self Care | Payer: Medicaid Other | Source: Ambulatory Visit | Attending: Family Medicine | Admitting: Family Medicine

## 2016-06-10 DIAGNOSIS — Z3A00 Weeks of gestation of pregnancy not specified: Secondary | ICD-10-CM | POA: Insufficient documentation

## 2016-06-10 DIAGNOSIS — Z3483 Encounter for supervision of other normal pregnancy, third trimester: Secondary | ICD-10-CM | POA: Diagnosis not present

## 2016-06-10 NOTE — MAU Note (Signed)
Pt reports strong contractions and back pain. Denies ROM or bleeding.

## 2016-06-12 ENCOUNTER — Encounter (HOSPITAL_COMMUNITY): Payer: Self-pay

## 2016-06-12 ENCOUNTER — Inpatient Hospital Stay (HOSPITAL_COMMUNITY)
Admission: AD | Admit: 2016-06-12 | Discharge: 2016-06-12 | Disposition: A | Discharge status: Home / Self Care | Payer: Medicaid Other | Source: Ambulatory Visit | Attending: Family Medicine | Admitting: Family Medicine

## 2016-06-12 DIAGNOSIS — O471 False labor at or after 37 completed weeks of gestation: Secondary | ICD-10-CM

## 2016-06-12 DIAGNOSIS — Z3A40 40 weeks gestation of pregnancy: Secondary | ICD-10-CM | POA: Diagnosis not present

## 2016-06-12 DIAGNOSIS — O4693 Antepartum hemorrhage, unspecified, third trimester: Secondary | ICD-10-CM | POA: Insufficient documentation

## 2016-06-12 NOTE — Discharge Instructions (Signed)
Keep your scheduled appt for prenatal care.  °

## 2016-06-12 NOTE — MAU Note (Signed)
[facility], but [facility] been coming [facility]ad[facility]. States she plans to have her records transferred today because she wants to deliver here. Discussed with patient the importance of having continuity of care and having her prenatal records available. Patient wants to be scheduled for induction. Informed patient that induction is not scheduled through the emergency room, that she needs to be evaluated by her doctor so the best decision can be made for the safety and well-being of her and her baby. Advised pt to see [facility] to discuss induction.

## 2016-06-12 NOTE — MAU Note (Signed)
PT  SAYS   HURT BAD  WITH [facility]  AT 3 AM.   [facility]NC   WITH [facility]-   BUT  NO  PNV  X1  MTH   .  1-2  CM   IN MAU    LAST.     DENIES HSV AND MRSA.  GBS- UNSURE.

## 2016-08-09 ENCOUNTER — Encounter (HOSPITAL_COMMUNITY): Payer: Self-pay

## 2017-01-24 ENCOUNTER — Encounter (HOSPITAL_COMMUNITY): Payer: Self-pay | Admitting: Emergency Medicine

## 2017-01-24 ENCOUNTER — Emergency Department (HOSPITAL_COMMUNITY)
Admission: EM | Admit: 2017-01-24 | Discharge: 2017-01-24 | Disposition: A | Discharge status: Home / Self Care | Payer: Managed Care, Other (non HMO) | Attending: Emergency Medicine | Admitting: Emergency Medicine

## 2017-01-24 DIAGNOSIS — Z79899 Other long term (current) drug therapy: Secondary | ICD-10-CM | POA: Diagnosis not present

## 2017-01-24 DIAGNOSIS — N12 Tubulo-interstitial nephritis, not specified as acute or chronic: Secondary | ICD-10-CM | POA: Diagnosis not present

## 2017-01-24 DIAGNOSIS — R350 Frequency of micturition: Secondary | ICD-10-CM | POA: Diagnosis present

## 2017-01-24 LAB — URINALYSIS, ROUTINE W REFLEX MICROSCOPIC
BILIRUBIN URINE: NEGATIVE
Glucose, UA: NEGATIVE mg/dL
KETONES UR: NEGATIVE mg/dL
NITRITE: NEGATIVE
PROTEIN: 100 mg/dL — AB
SPECIFIC GRAVITY, URINE: 1.011 (ref 1.005–1.030)
Squamous Epithelial / LPF: NONE SEEN
pH: 6 (ref 5.0–8.0)

## 2017-01-24 LAB — BASIC METABOLIC PANEL
Anion gap: 8 (ref 5–15)
BUN: 17 mg/dL (ref 6–20)
CALCIUM: 8.8 mg/dL — AB (ref 8.9–10.3)
CHLORIDE: 103 mmol/L (ref 101–111)
CO2: 27 mmol/L (ref 22–32)
CREATININE: 0.69 mg/dL (ref 0.44–1.00)
GFR calc non Af Amer: >60 mL/min (ref >60)
Glucose, Bld: 96 mg/dL (ref 65–99)
Potassium: 3.8 mmol/L (ref 3.5–5.1)
SODIUM: 138 mmol/L (ref 135–145)

## 2017-01-24 LAB — CBC
HCT: 35.3 % — ABNORMAL LOW (ref 36.0–46.0)
Hemoglobin: 11.6 g/dL — ABNORMAL LOW (ref 12.0–15.0)
MCH: 27.9 pg (ref 26.0–34.0)
MCHC: 32.9 g/dL (ref 30.0–36.0)
MCV: 84.9 fL (ref 78.0–100.0)
PLATELETS: 294 10*3/uL (ref 150–400)
RBC: 4.16 MIL/uL (ref 3.87–5.11)
RDW: 13.4 % (ref 11.5–15.5)
WBC: 10.9 10*3/uL — AB (ref 4.0–10.5)

## 2017-01-24 LAB — POC URINE PREG, ED: PREG TEST UR: NEGATIVE

## 2017-01-24 MED ORDER — SODIUM CHLORIDE 0.9 % IV BOLUS (SEPSIS)
1000.0000 mL | Freq: Once | INTRAVENOUS | Status: AC
  Administered 2017-01-24: 1000 mL via INTRAVENOUS

## 2017-01-24 MED ORDER — HYDROCODONE-ACETAMINOPHEN 5-325 MG PO TABS
2.0000 | ORAL_TABLET | Freq: Once | ORAL | Status: AC
  Administered 2017-01-24: 2 via ORAL
  Filled 2017-01-24: qty 2

## 2017-01-24 MED ORDER — CEPHALEXIN 500 MG PO CAPS: 500.0000 mg | ORAL_CAPSULE | Freq: Four times a day (QID) | ORAL | 0 refills | Status: DC

## 2017-01-24 MED ORDER — ONDANSETRON HCL 4 MG/2ML IJ SOLN
4.0000 mg | Freq: Once | INTRAMUSCULAR | Status: AC
  Administered 2017-01-24: 4 mg via INTRAVENOUS
  Filled 2017-01-24: qty 2

## 2017-01-24 MED ORDER — DEXTROSE 5 % IV SOLN
1.0000 g | Freq: Once | INTRAVENOUS | Status: AC
  Administered 2017-01-24: 1 g via INTRAVENOUS
  Filled 2017-01-24: qty 10

## 2017-01-24 MED ORDER — PHENAZOPYRIDINE HCL 100 MG PO TABS
200.0000 mg | ORAL_TABLET | Freq: Once | ORAL | Status: AC
  Administered 2017-01-24: 200 mg via ORAL
  Filled 2017-01-24: qty 2

## 2017-01-24 NOTE — Discharge Instructions (Signed)
1. Medications: Keflex, usual home medications °2. Treatment: rest, drink plenty of fluids, take medications as prescribed °3. Follow Up: Please followup with your primary doctor in 3 days for discussion of your diagnoses and further evaluation after today's visit; if you do not have a primary care doctor use the resource guide provided to find one; return to the ER for fevers, persistent vomiting, worsening abdominal pain or other concerning symptoms. ° °

## 2017-01-24 NOTE — ED Provider Notes (Signed)
[facility]-EMERGENCY DEPT Provider Note   CSN: [id number] Arrival date & time: 01/24/17  0138     History   Chief Complaint Chief Complaint  Patient presents with  . Hematuria  . Urinary Frequency    HPI Ruth Lin is a 21 y.o. female with a hx of anemia presents to the Emergency Department complaining of gradual, persistent, progressively worsening Urinary frequency, urinary urgency, hematuria onset last night. Associated symptoms include low back pain, flank pain and nausea without vomiting. Treatments prior to arrival. No aggravating or alleviating factors. Pt denies ever, chills, headache or neck pain, chest pain, shortness of breath, abdominal pain, vomiting, diarrhea, weakness, dizziness, syncope.  She reports she is 7 months postpartum and is currently breast-feeding. LMP: prior to pregnancy   The history is provided by the patient and medical records. No language interpreter was used.    Past Medical History:  Diagnosis Date  . Anemia   . Hemorrhoid     There are no active problems to display for this patient.   Past Surgical History:  Procedure Laterality Date  . NO PAST SURGERIES      OB History    Gravida Para Term Preterm AB Living   SAB TAB Ectopic Multiple Live Births           1       Home Medications    Prior to Admission medications   Medication Sig Start Date End Date Taking? Authorizing Provider  cephALEXin (KEFLEX) 500 MG capsule Take 1 capsule (500 mg total) by mouth 4 (four) times daily. 01/24/17   Shalicia Craghead, PA-C  cyclobenzaprine (FLEXERIL) 10 MG tablet Take 1 tablet (10 mg total) by mouth 3 (three) times daily as needed for muscle spasms. Patient not taking: Reported on 06/12/2016 04/21/16   Montez Morita, CNM  hydrocortisone cream 1 % Apply 1 application topically 2 (two) times daily.    Historical Provider, MD  IRON PO Take 1 tablet by mouth 2 (two) times daily.    Historical Provider, MD  Prenatal Vit-Fe Fumarate-FA  (PRENATAL MULTIVITAMIN) TABS tablet Take 1 tablet by mouth daily at 12 noon.    Historical Provider, MD    Family History Family History  Problem Relation Age of Onset  . Diabetes Maternal Grandfather     Social History Social History  Substance Use Topics  . Smoking status: Never Smoker  . Smokeless tobacco: Never Used  . Alcohol use No     Allergies   Patient has no known allergies.   Review of Systems Review of Systems  Genitourinary: Positive for dysuria, flank pain, frequency and hematuria.  Musculoskeletal: Positive for back pain.  All other systems reviewed and are negative.    Physical Exam Updated Vital Signs BP 114/68 (BP Location: Left Arm)   Pulse 69   Temp 98.4 F (36.9 C) (Oral)   Resp 17   LMP  (LMP Unknown)   SpO2 99%   Breastfeeding? Yes Comment: patient's LMP before pregnancy, baby is 7 months  Physical Exam  Constitutional: She appears well-developed and well-nourished. No distress.  Awake, alert, nontoxic appearance  HENT:  Head: Normocephalic and atraumatic.  Mouth/Throat: Oropharynx is clear and moist. No oropharyngeal exudate.  Eyes: Conjunctivae are normal. No scleral icterus.  Neck: Normal range of motion. Neck supple.  Cardiovascular: Normal rate, regular rhythm, normal heart sounds and intact distal pulses.   Pulmonary/Chest: Effort normal and breath sounds normal. No respiratory  distress. She has no wheezes.  Equal chest expansion  Abdominal: Soft. Bowel sounds are normal. She exhibits no distension and no mass. There is tenderness in the suprapubic area. There is CVA tenderness (left). There is no rebound and no guarding.  Musculoskeletal: Normal range of motion. She exhibits no edema.  Neurological: She is alert.  Speech is clear and goal oriented Moves extremities without ataxia  Skin: Skin is warm and dry. No rash noted. She is not diaphoretic.  Psychiatric: She has a normal mood and affect.  Nursing note and vitals  reviewed.    ED Treatments / Results  Labs (all labs ordered are listed, but only abnormal results are displayed) Labs Reviewed  URINALYSIS, ROUTINE W REFLEX MICROSCOPIC - Abnormal; Notable for the following:       Result Value   Color, Urine STRAW (*)    APPearance HAZY (*)    Hgb urine dipstick LARGE (*)    Protein, ur 100 (*)    Leukocytes, UA LARGE (*)    Bacteria, UA RARE (*)    All other components within normal limits  BASIC METABOLIC PANEL - Abnormal; Notable for the following:    Calcium 8.8 (*)    All other components within normal limits  CBC - Abnormal; Notable for the following:    WBC 10.9 (*)    Hemoglobin 11.6 (*)    HCT 35.3 (*)    All other components within normal limits  URINE CULTURE  POC URINE PREG, ED    EKG  EKG Interpretation None       Radiology No results found.  Procedures Procedures (including critical care time)  Medications Ordered in ED Medications  HYDROcodone-acetaminophen (NORCO/VICODIN) 5-325 MG per tablet 2 tablet (not administered)  sodium chloride 0.9 % bolus 1,000 mL (1,000 mLs Intravenous New Bag/Given 01/24/17 0446)  cefTRIAXone (ROCEPHIN) 1 g in dextrose 5 % 50 mL IVPB (1 g Intravenous New Bag/Given 01/24/17 0451)  phenazopyridine (PYRIDIUM) tablet 200 mg (200 mg Oral Given 01/24/17 0450)  ondansetron (ZOFRAN) injection 4 mg (4 mg Intravenous Given 01/24/17 0451)     Initial Impression / Assessment and Plan / ED Course  I have reviewed the triage vital signs and the nursing notes.  Pertinent labs & imaging results that were available during my care of the patient were reviewed by me and considered in my medical decision making (see chart for details).     Pt pyelonephritis.  She is afebrile, normotensive, and without emesis.  She does have CVA tenderness and nausea.  She is tolerating PO in the department without difficulty.  She has been given fluids and abx here in the ED.  Pt to be dc home with antibiotics and  instructions to follow up with PCP if symptoms persist.Pt given strict return precautions for fevers, vomiting or worsening pain.   Final Clinical Impressions(s) / ED Diagnoses   Final diagnoses:  Pyelonephritis    New Prescriptions New Prescriptions   CEPHALEXIN (KEFLEX) 500 MG CAPSULE    Take 1 capsule (500 mg total) by mouth 4 (four) times daily.     Dahlia Client Rhodesia Stanger, PA-C 01/24/17 0522    Gilda Crease, MD 01/24/17 [phone]

## 2017-01-24 NOTE — ED Triage Notes (Signed)
Patient here with hematuria, urinary frequency, lower back and flank pain.  Patient states that the symptoms started last night.  She does have some nausea.  She is 7 months post partum and is breast feeding.

## 2017-01-26 LAB — URINE CULTURE: Culture: >=100000 — AB

## 2017-01-27 ENCOUNTER — Telehealth: Payer: Self-pay | Admitting: Emergency Medicine

## 2017-01-27 NOTE — Telephone Encounter (Signed)
Post ED Visit - Positive Culture Follow-up  Culture report reviewed by antimicrobial stewardship pharmacist:   Enzo Bi, Pharm.D.  Celedonio Miyamoto, Pharm.D., BCPS AQ-ID  Garvin Fila, Pharm.D., BCPS  Georgina Pillion, Pharm.D., BCPS  [place], [address].D., BCPS, AAHIVP  Estella Husk, Pharm.D., BCPS, AAHIVP  Lysle Pearl, PharmD, BCPS  Casilda Carls, PharmD, BCPS  Pollyann Samples, PharmD, BCPS  Positive urine culture Treated with cephalexin, organism sensitive to the same and no further patient follow-up is required at this time.  Ruth Lin, Ruth Lin 01/27/2017, 9:47 AM

## 2017-10-21 NOTE — L&D Delivery Note (Signed)
Delivery Note Pt pushed with one ctx for delivery.  At 2:29 PM a viable and healthy female was delivered via Vaginal, Spontaneous (Presentation:OA; LOT  ).  APGAR: 9,9 ; weight P .   Placenta status: delivered, intact .  Cord: 3V with the following complications: none, shoulder nuchal .    Anesthesia:  epidural Episiotomy: None Lacerations:  R labial Suture Repair: 3.0 vicryl rapide Est. Blood Loss (mL):  157  Mom to postpartum.  Baby to Couplet care / Skin to Skin.  Teri Legacy Bovard-Stuckert 08/11/2018, 2:46 PM  Br/RNI/Tdap in PNC/A+/Contra?  Doesn't desire circ

## 2017-11-15 IMAGING — US US OB TRANSVAGINAL
1 series · 15 of 28 positions shown · non-contrast
Comparison: None.

CLINICAL DATA: Abdominal pain and vaginal bleeding in first
trimester pregnancy. nausea and vomiting. Unsure of LMP.

EXAM:
OBSTETRIC <14 WK US AND TRANSVAGINAL OB US
TECHNIQUE: Both transabdominal and transvaginal ultrasound examinations were
performed for complete evaluation of the gestation as well as the
maternal uterus, adnexal regions, and pelvic cul-de-sac.
Transvaginal technique was performed to assess early pregnancy.

[Series 1: us ob transvaginal · 111 acquisitions, 15 frames shown]
[im 1/111]
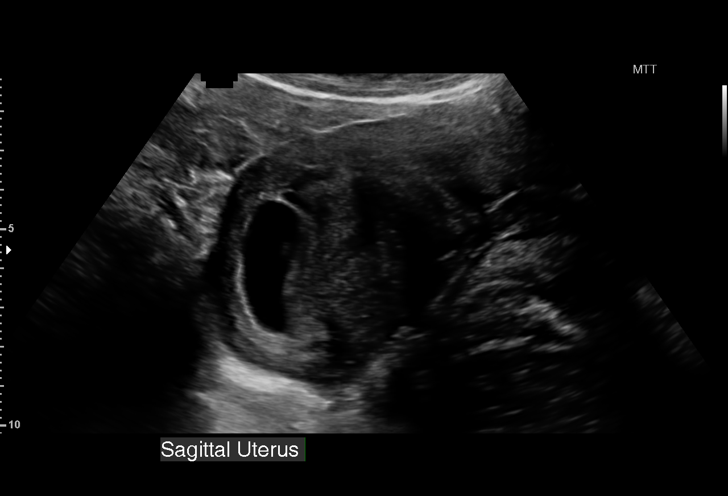
[im 9/111]
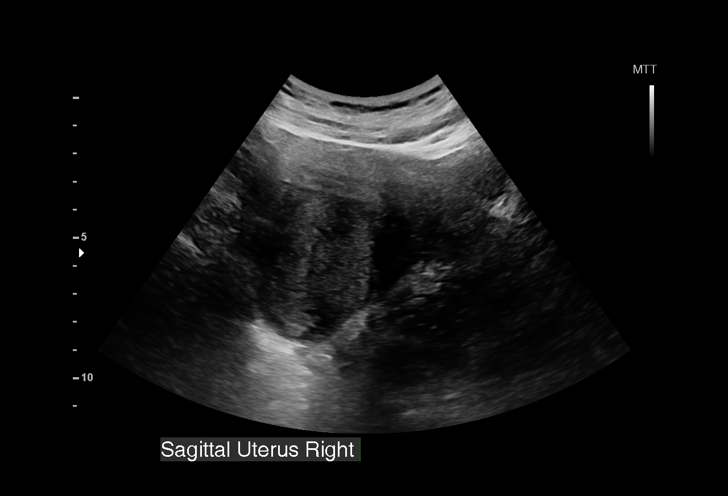
[im 17/111]
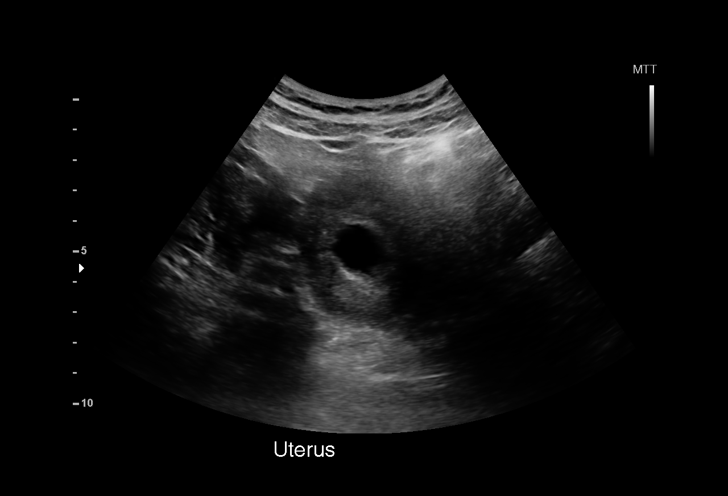
[im 25/111]
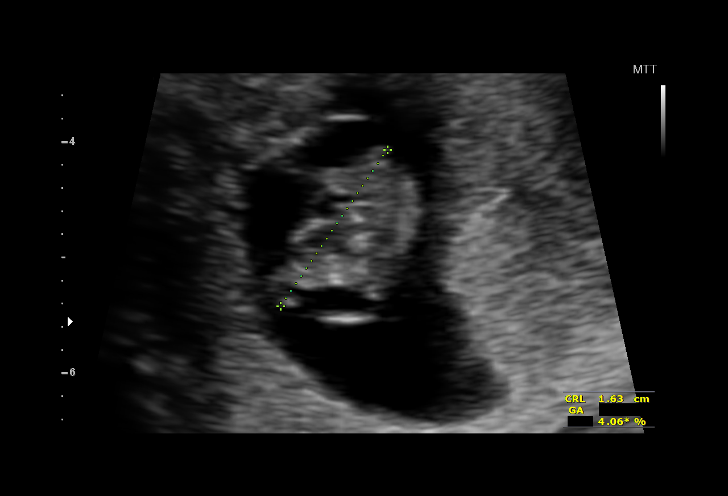
[im 33/111]
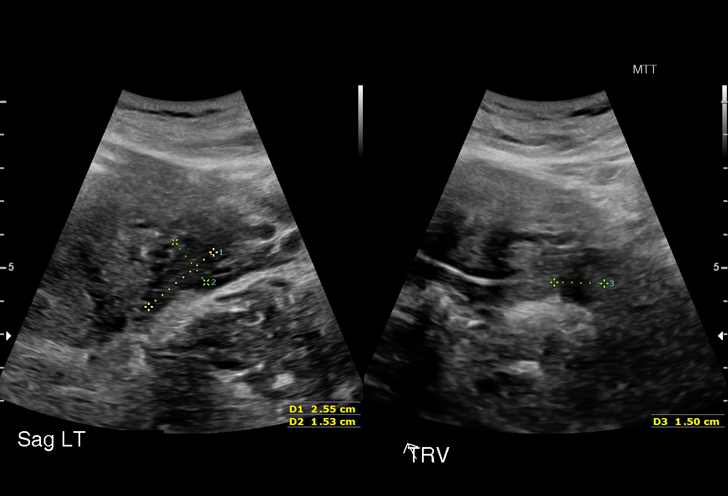
[im 41/111]
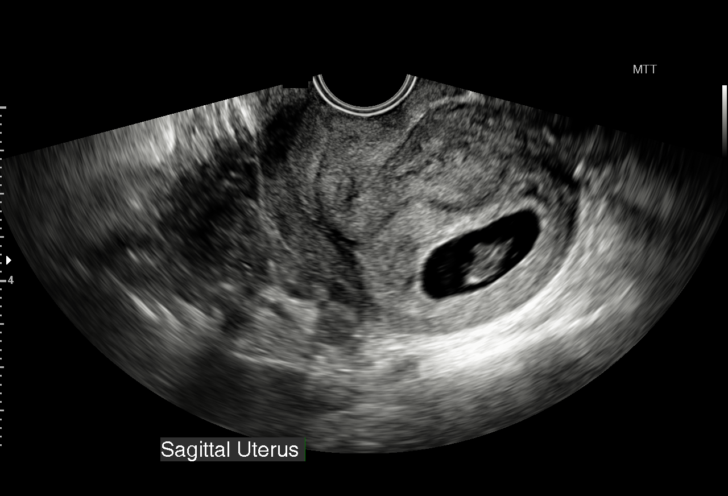
[im 49/111]
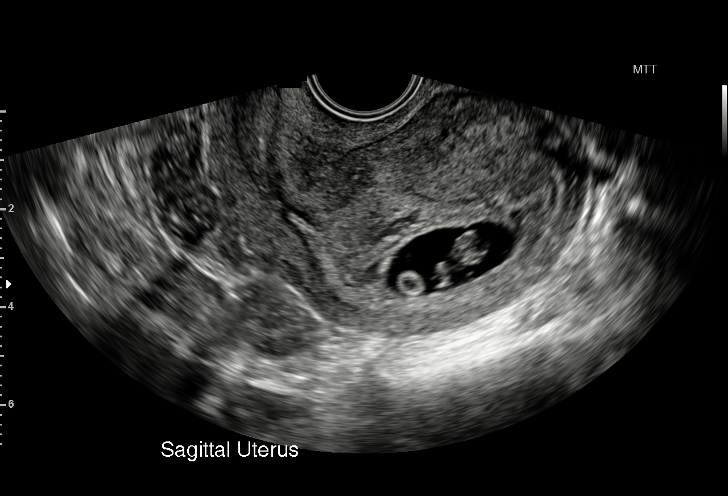
[im 58/111]
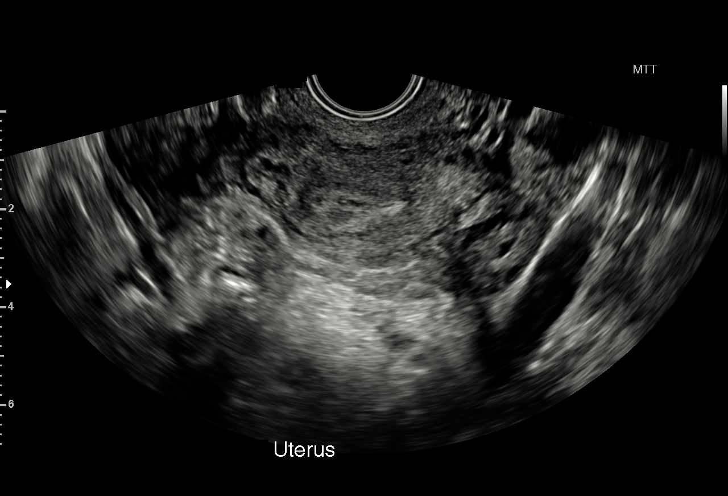
[im 62/111]
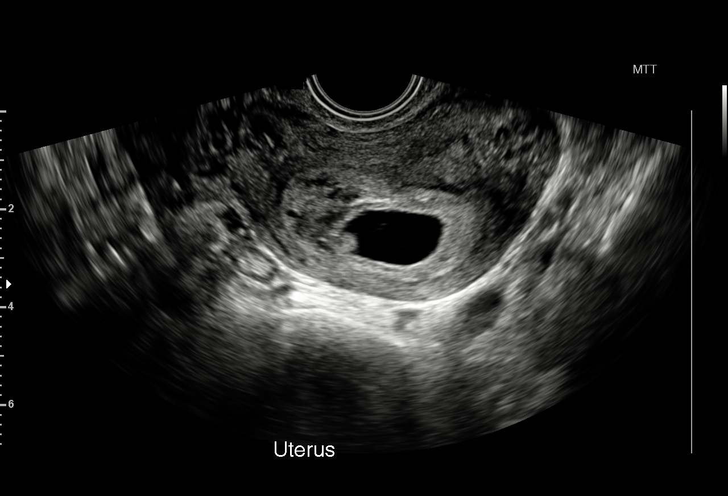
[im 70/111]
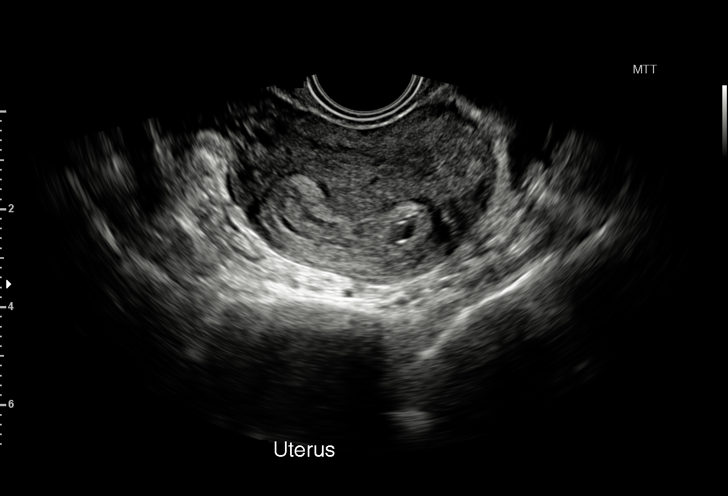
[im 78/111]
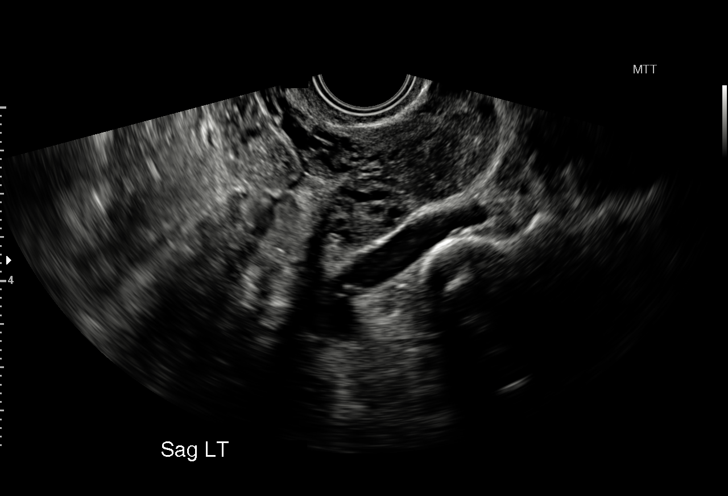
[im 86/111]
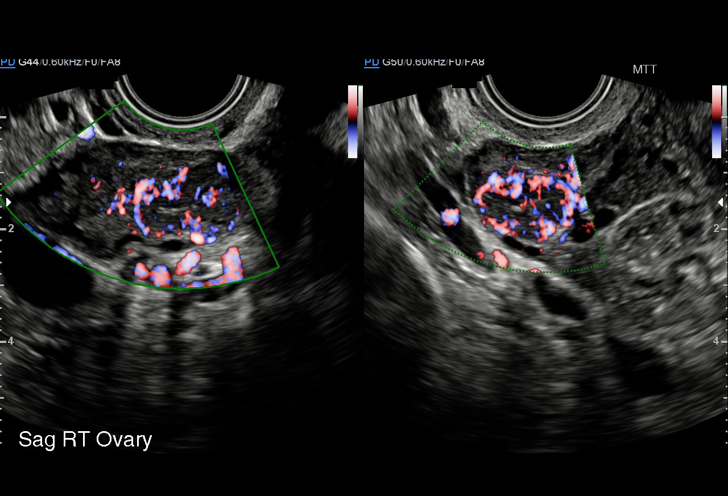
[im 94/111]
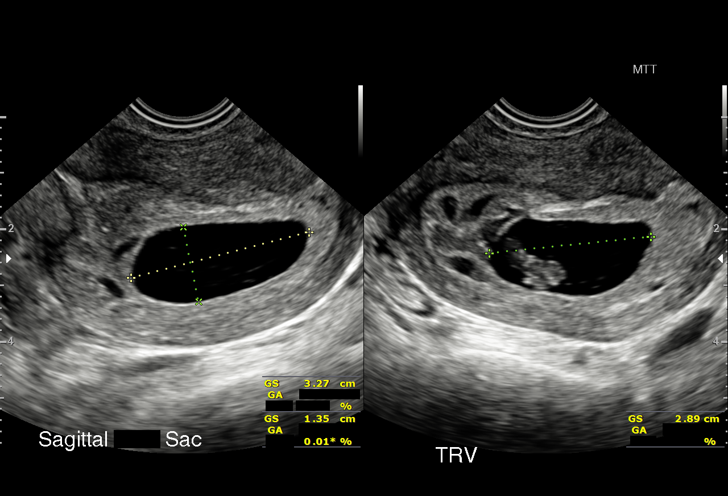
[im 102/111]
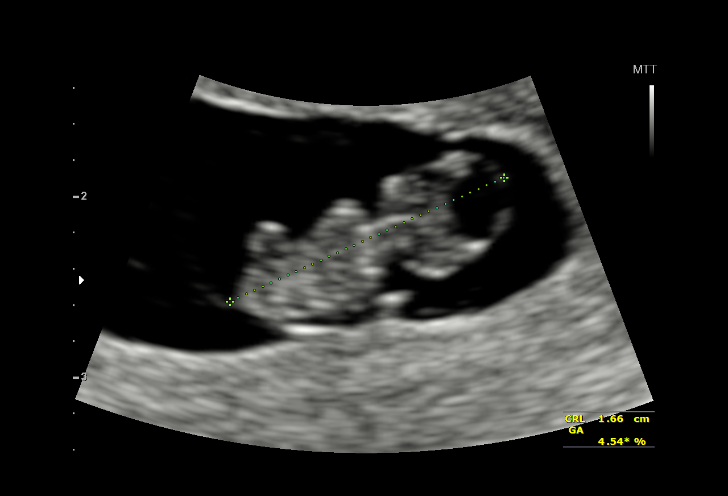
[im 111/111]
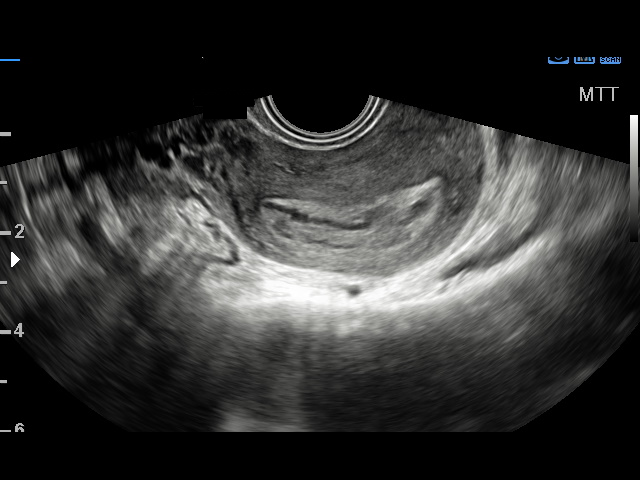

[15 of 28 positions shown; findings below may reference images not displayed]

FINDINGS: Intrauterine gestational sac: Visualized/normal in shape.

Yolk sac:  Visualized

Embryo:  Visualized

Cardiac Activity: Visualized

Heart Rate: 158  bpm

CRL:  16  mm   8 w   0 d                  US EDC: 06/11/2016

Subchorionic hemorrhage:  None visualized.

Maternal uterus/adnexae: Retroverted uterus. Both ovaries are
unremarkable in appearance. No mass or abnormal free fluid
visualized.
IMPRESSION: Single living IUP measuring 8 weeks 0 days with US EDC of
06/11/2016.

No significant maternal uterine or adnexal abnormality identified.

## 2018-01-27 LAB — OB RESULTS CONSOLE GC/CHLAMYDIA
CHLAMYDIA, DNA PROBE: NEGATIVE
Gonorrhea: NEGATIVE

## 2018-01-27 LAB — OB RESULTS CONSOLE HEPATITIS B SURFACE ANTIGEN: Hepatitis B Surface Ag: NEGATIVE

## 2018-01-27 LAB — OB RESULTS CONSOLE ABO/RH: RH TYPE: POSITIVE

## 2018-01-27 LAB — OB RESULTS CONSOLE RPR: RPR: NONREACTIVE

## 2018-01-27 LAB — OB RESULTS CONSOLE ANTIBODY SCREEN: ANTIBODY SCREEN: NEGATIVE

## 2018-01-27 LAB — OB RESULTS CONSOLE HIV ANTIBODY (ROUTINE TESTING): HIV: NONREACTIVE

## 2018-01-27 LAB — OB RESULTS CONSOLE RUBELLA ANTIBODY, IGM: Rubella: NON-IMMUNE/NOT IMMUNE

## 2018-03-11 ENCOUNTER — Encounter (HOSPITAL_COMMUNITY): Payer: Self-pay | Admitting: Emergency Medicine

## 2018-03-11 ENCOUNTER — Ambulatory Visit (HOSPITAL_COMMUNITY)
Admission: EM | Admit: 2018-03-11 | Discharge: 2018-03-11 | Disposition: A | Discharge status: Home / Self Care | Payer: Managed Care, Other (non HMO)

## 2018-03-11 DIAGNOSIS — R519 Headache, unspecified: Secondary | ICD-10-CM

## 2018-03-11 DIAGNOSIS — R590 Localized enlarged lymph nodes: Secondary | ICD-10-CM | POA: Diagnosis not present

## 2018-03-11 DIAGNOSIS — R51 Headache: Secondary | ICD-10-CM

## 2018-03-11 NOTE — Discharge Instructions (Addendum)
You may take  Tylenol every 6 hours for pain and inflammation. Try warm compresses for 20 minutes, 3 times daily. It would be important for your PCP to obtain an ultrasound.

## 2018-03-11 NOTE — ED Triage Notes (Signed)
Pt sts head pain and "lumps behind ears"

## 2018-03-11 NOTE — ED Notes (Signed)
Pt. Stated, I have these bumps came up behind left ear and now I have a headache.

## 2018-03-11 NOTE — ED Provider Notes (Signed)
  MRN: [id number] DOB: 1996-08-09  Subjective:   Ruth Lin is a 22 y.o. female presenting for 1 week history of right side swelling, pain behind her ear, mild pain of her ear as well. Has had right temporal headache as well. Has been using Tylenol once daily with some relief. Patient is pregnant. Denies fever, ear drainage, runny nose, sore throat.  She does not have a PCP.  She asked her OB/GYN about this as well and was told that they would not handle anything that was not pregnancy related.  No current facility-administered medications for this encounter.   Current Outpatient Medications:  .  hydrocortisone cream 1 %, Apply 1 application topically 2 (two) times daily., Disp: , Rfl:  .  IRON PO, Take 1 tablet by mouth 2 (two) times daily., Disp: , Rfl:  .  Prenatal Vit-Fe Fumarate-FA (PRENATAL MULTIVITAMIN) TABS tablet, Take 1 tablet by mouth daily at 12 noon., Disp: , Rfl:    No Known Allergies  Past Medical History:  Diagnosis Date  . Anemia   . Hemorrhoid      Past Surgical History:  Procedure Laterality Date  . NO PAST SURGERIES      Objective:   Vitals: BP (!) 114/54 (BP Location: Left Arm)   Pulse 75   Temp 98.2 F (36.8 C) (Oral)   Resp 18   SpO2 100%   Physical Exam  Constitutional: She is oriented to person, place, and time. She appears well-developed and well-nourished.  HENT:  Mouth/Throat: Oropharynx is clear and moist.  Eyes: Pupils are equal, round, and reactive to light. EOM are normal. No scleral icterus.  Neck: Normal range of motion. Neck supple.  Cardiovascular: Normal rate.  Pulmonary/Chest: Effort normal.  Lymphadenopathy:       Head (right side): Posterior auricular adenopathy present. No submental, no submandibular, no preauricular and no occipital adenopathy present.       Head (left side): No submental, no submandibular, no preauricular, no posterior auricular and no occipital adenopathy present.    She has no cervical adenopathy.       Right:  No supraclavicular adenopathy present.       Left: No supraclavicular adenopathy present.  Neurological: She is alert and oriented to person, place, and time.  Skin: Skin is warm and dry.    Assessment and Plan :   Lymphadenopathy, postauricular  Right-sided headache  Counseled patient that she should try to establish care with a primary care doctor and see if they could pursue a ultrasound to make sure that they look into whether or not the swelling behind her right ear is the lymph node.  For now she will use Tylenol every 6 hours as needed given that she is pregnant.  Recommend she could use warm compresses as well.  Follow-up as needed.     Wallis Bamberg, [place] 03/11/18 1945

## 2018-03-12 ENCOUNTER — Encounter (HOSPITAL_COMMUNITY): Payer: Self-pay | Admitting: *Deleted

## 2018-03-12 ENCOUNTER — Inpatient Hospital Stay (HOSPITAL_COMMUNITY)
Admission: AD | Admit: 2018-03-12 | Discharge: 2018-03-12 | Disposition: A | Discharge status: Home / Self Care | Payer: Managed Care, Other (non HMO) | Source: Ambulatory Visit | Attending: Obstetrics and Gynecology | Admitting: Obstetrics and Gynecology

## 2018-03-12 DIAGNOSIS — R591 Generalized enlarged lymph nodes: Secondary | ICD-10-CM | POA: Insufficient documentation

## 2018-03-12 DIAGNOSIS — R51 Headache: Secondary | ICD-10-CM | POA: Diagnosis present

## 2018-03-12 DIAGNOSIS — O9989 Other specified diseases and conditions complicating pregnancy, childbirth and the puerperium: Secondary | ICD-10-CM | POA: Diagnosis not present

## 2018-03-12 DIAGNOSIS — Z3A17 17 weeks gestation of pregnancy: Secondary | ICD-10-CM | POA: Insufficient documentation

## 2018-03-12 DIAGNOSIS — R590 Localized enlarged lymph nodes: Secondary | ICD-10-CM

## 2018-03-12 DIAGNOSIS — O26892 Other specified pregnancy related conditions, second trimester: Secondary | ICD-10-CM | POA: Diagnosis not present

## 2018-03-12 LAB — URINALYSIS, ROUTINE W REFLEX MICROSCOPIC
Bilirubin Urine: NEGATIVE
GLUCOSE, UA: NEGATIVE mg/dL
Hgb urine dipstick: NEGATIVE
KETONES UR: NEGATIVE mg/dL
LEUKOCYTES UA: NEGATIVE
NITRITE: NEGATIVE
PROTEIN: NEGATIVE mg/dL
Specific Gravity, Urine: 1.019 (ref 1.005–1.030)
pH: 6 (ref 5.0–8.0)

## 2018-03-12 NOTE — MAU Provider Note (Signed)
History     CSN: [id number]  Arrival date and time: 03/12/18 1831   First Provider Initiated Contact with Patient 03/12/18 2003     Chief Complaint  Patient presents with  . Otalgia   HPI Ruth Lin is a 22 y.o. G3P1001 at [redacted]w[redacted]d who presents with a "bump" behind her ear and headache. She states it started on Tuesday and has gradually gotten worse. She went to the ER yesterday for the same complaint and was told to follow up with a PCP. She states nothing has changed from her ER visit yesterday. She is rating her headache a 4/10 and is taking tylenol every 6 hours. She denies any cough, congestion or sore throat. She denies any abdominal pain, vaginal bleeding or leaking of fluid. Reports feeling fetal movement.   OB History    Gravida  3   Para  1   Term  1   Preterm      AB      Living  1     SAB      TAB      Ectopic      Multiple      Live Births  1           Past Medical History:  Diagnosis Date  . Anemia   . Hemorrhoid     Past Surgical History:  Procedure Laterality Date  . NO PAST SURGERIES    . WISDOM TOOTH EXTRACTION Bilateral 2017    Family History  Problem Relation Age of Onset  . Diabetes Maternal Grandfather     Social History   Tobacco Use  . Smoking status: Never Smoker  . Smokeless tobacco: Never Used  Substance Use Topics  . Alcohol use: No  . Drug use: No    Allergies: No Known Allergies  Medications Prior to Admission  Medication Sig Dispense Refill Last Dose  . hydrocortisone cream 1 % Apply 1 application topically 2 (two) times daily.   06/11/2016 at Unknown time  . IRON PO Take 1 tablet by mouth 2 (two) times daily.   06/11/2016 at Unknown time  . Prenatal Vit-Fe Fumarate-FA (PRENATAL MULTIVITAMIN) TABS tablet Take 1 tablet by mouth daily at 12 noon.   06/11/2016 at Unknown time    Review of Systems  Constitutional: Negative.  Negative for fatigue and fever.  HENT: Positive for ear pain.   Respiratory: Negative.   Negative for shortness of breath.   Cardiovascular: Negative.  Negative for chest pain.  Gastrointestinal: Negative.  Negative for abdominal pain, constipation, diarrhea, nausea and vomiting.  Genitourinary: Negative.  Negative for dysuria.  Neurological: Positive for headaches. Negative for dizziness.   Physical Exam   Blood pressure (!) 99/58, pulse 85, temperature 98.7 F (37.1 C), temperature source Oral, resp. rate 20, height 5' (1.524 m), weight 141 lb 8 oz (64.2 kg), last menstrual period 11/09/2017, currently breastfeeding.  Physical Exam  Nursing note and vitals reviewed. Constitutional: She is oriented to person, place, and time. She appears well-developed and well-nourished. No distress.  HENT:  Head: Normocephalic.  Right Ear: There is tenderness.  Eyes: Pupils are equal, round, and reactive to light.  Cardiovascular: Normal rate, regular rhythm and normal heart sounds.  Respiratory: Effort normal and breath sounds normal. No respiratory distress.  GI: Soft. Bowel sounds are normal. She exhibits no distension. There is no tenderness.  Lymphadenopathy:       Head (right side): Posterior auricular adenopathy present.  Neurological: She is alert and oriented  to person, place, and time. She has normal reflexes. She displays normal reflexes. No cranial nerve deficit. She exhibits normal muscle tone.  Skin: Skin is warm and dry.  Psychiatric: She has a normal mood and affect. Her behavior is normal. Judgment and thought content normal.    FHT: 153 bpm  MAU Course  Procedures Results for orders placed or performed during the hospital encounter of 03/12/18 (from the past 24 hour(s))  Urinalysis, Routine w reflex microscopic     Status: None   Collection Time: 03/12/18  7:21 PM  Result Value Ref Range   Color, Urine YELLOW YELLOW   APPearance CLEAR CLEAR   Specific Gravity, Urine 1.019 1.005 - 1.030   pH 6.0 5.0 - 8.0   Glucose, UA NEGATIVE NEGATIVE mg/dL   Hgb urine  dipstick NEGATIVE NEGATIVE   Bilirubin Urine NEGATIVE NEGATIVE   Ketones, ur NEGATIVE NEGATIVE mg/dL   Protein, ur NEGATIVE NEGATIVE mg/dL   Nitrite NEGATIVE NEGATIVE   Leukocytes, UA NEGATIVE NEGATIVE   MDM Prenatal records from private office reviewed. Labs ordered and reviewed. UA  Dr. Ellyn Hack in operating room. Consulted with Dr. Macon Large- ok to discharge patient home to follow up with PCP for management.  Assessment and Plan   1. Lymphadenopathy, postauricular   2. [redacted] weeks gestation of pregnancy    -Discharge home in stable condition -Infection precautions discussed -Patient advised to follow-up with PCP for management of lymphadenopathy. -Encouraged patient to continue to use tylenol for pain management.  -Patient may return to MAU as needed or if her condition were to change or worsen  Rolm Bookbinder CNM 03/12/2018, 8:03 PM

## 2018-03-12 NOTE — Discharge Instructions (Signed)
Lymphadenopathy Lymphadenopathy refers to swollen or enlarged lymph glands, also called lymph nodes. Lymph glands are part of your body's defense (immune) system, which protects the body from infections, germs, and diseases. Lymph glands are found in many locations in your body, including the neck, underarm, and groin. Many things can cause lymph glands to become enlarged. When your immune system responds to germs, such as viruses or bacteria, infection-fighting cells and fluid build up. This causes the glands to grow in size. Usually, this is not something to worry about. The swelling and any soreness often go away without treatment. However, swollen lymph glands can also be caused by a number of diseases. Your health care provider may do various tests to help determine the cause. If the cause of your swollen lymph glands cannot be found, it is important to monitor your condition to make sure the swelling goes away. Follow these instructions at home: Watch your condition for any changes. The following actions may help to lessen any discomfort you are feeling:  Get plenty of rest.  Take medicines only as directed by your health care provider. Your health care provider may recommend over-the-counter medicines for pain.  Apply moist heat compresses to the site of swollen lymph nodes as directed by your health care provider. This can help reduce any pain.  Check your lymph nodes daily for any changes.  Keep all follow-up visits as directed by your health care provider. This is important.  Contact a health care provider if:  Your lymph nodes are still swollen after 2 weeks.  Your swelling increases or spreads to other areas.  Your lymph nodes are hard, seem fixed to the skin, or are growing rapidly.  Your skin over the lymph nodes is red and inflamed.  You have a fever.  You have chills.  You have fatigue.  You develop a sore throat.  You have abdominal pain.  You have weight  loss.  You have night sweats. Get help right away if:  You notice fluid leaking from the area of the enlarged lymph node.  You have severe pain in any area of your body.  You have chest pain.  You have shortness of breath. This information is not intended to replace advice given to you by your health care provider. Make sure you discuss any questions you have with your health care provider. Document Released: 07/16/2008 Document Revised: 03/14/2016 Document Reviewed: 05/12/2014 Elsevier Interactive Patient Education  2018 Elsevier Inc.  

## 2018-03-12 NOTE — MAU Note (Signed)
PT SAYS SHE HAS A LUMP BEHIND  RIGHT EAR- NOTICED ON Tuesday -   PAINFUL ALL TIME  BUT WORSE AT TIMES - THEN GIVES HER A H/A.   SHE CALLED OB DR-  TOLD TO GET CHECKED. Marland Kitchen    ALSO-   WHEN SHE DOES EXERCISES   FEELS ABD PAIN.

## 2018-07-24 LAB — OB RESULTS CONSOLE GBS: STREP GROUP B AG: NEGATIVE

## 2018-08-04 ENCOUNTER — Telehealth (HOSPITAL_COMMUNITY): Payer: Self-pay | Admitting: *Deleted

## 2018-08-04 NOTE — Telephone Encounter (Signed)
Preadmission screen  

## 2018-08-10 NOTE — H&P (Signed)
Ruth Lin is a 22 y.o. female G3P2002 at 39+ for IOL given favorable cervix at term.  Relatively uncomplicated PNC - Received Tdap 7/30 and Flu 07/16/18.  Pregnancy dated by LMP cw early [place].  Bloodwork revealed RNI - MMR PP.  D/W pt r/b/a of IOL.    OB History    Gravida  3   Para  1   Term  1   Preterm      AB      Living  1     SAB      TAB      Ectopic      Multiple      Live Births  1         [id number] G1 4/15 SVD 40wk, female 7#2 G2 8/17 SVD, female, 40wk, 7#1 G3 current  No abn pap No STD  Past Medical History:  Diagnosis Date  . Anemia   . Hemorrhoid    Past Surgical History:  Procedure Laterality Date  . NO PAST SURGERIES    . WISDOM TOOTH EXTRACTION Bilateral 2017   Family History: family history includes Diabetes in her maternal grandfather. Social History:  reports that she has never smoked. She has never used smokeless tobacco. She reports that she does not drink alcohol or use drugs.married, student  Meds PNV All NKDA     Maternal Diabetes: No Genetic Screening: Normal Maternal Ultrasounds/Referrals: Normal Fetal Ultrasounds or other Referrals:  None Maternal Substance Abuse:  No Significant Maternal Medications:  None Significant Maternal Lab Results:  Lab values include: Group B Strep negative Other Comments:  None  Review of Systems  Constitutional: Negative.   HENT: Negative.   Eyes: Negative.   Respiratory: Negative.   Cardiovascular: Negative.   Gastrointestinal: Negative.   Genitourinary: Negative.   Musculoskeletal: Negative.   Skin: Negative.   Neurological: Negative.   Psychiatric/Behavioral: Negative.    Maternal Medical History:  Contractions: Frequency: irregular.    Fetal activity: Perceived fetal activity is normal.    Prenatal Complications - Diabetes: none.      Last menstrual period 11/09/2017, currently breastfeeding. Maternal Exam:  Uterine Assessment: Contraction frequency is irregular.   Abdomen:  Patient reports no abdominal tenderness. Fundal height is appropriate for gestation.   Estimated fetal weight is 7-7.5#.   Fetal presentation: vertex  Introitus: Normal vulva. Normal vagina.    Physical Exam  Constitutional: She is oriented to person, place, and time. She appears well-developed and well-nourished.  HENT:  Head: Normocephalic and atraumatic.  Cardiovascular: Normal rate and regular rhythm.  Respiratory: Effort normal and breath sounds normal. No respiratory distress. She has no wheezes.  GI: Soft. Bowel sounds are normal.  Musculoskeletal: Normal range of motion.  Neurological: She is alert and oriented to person, place, and time.  Skin: Skin is warm and dry.  Psychiatric: She has a normal mood and affect. Her behavior is normal.    Prenatal labs: ABO, Rh: A/Positive/-- (04/09 0000) Antibody: n (04/09 0000) Rubella: Nonimmune (04/09 0000) RPR: Nonreactive (04/09 0000)  HBsAg: Negative (04/09 0000)  HIV: Non-reactive (04/09 0000)  GBS: Negative (10/04 0000)   Dated by LMP c/w first tri [place]  Flu 9/26, Tdap 7/30  Hgb 12.2/Plt 284/Ur Cx neg/GC neg/Chl neg/ Varicella immune/Pap WNL/ Hgb electro WNL/nl First tri Scr/nl NT/glucola 93 [place] nl anat, ant plac, female Assessment/Plan: 22yo G3P2002 at 39+ for IOL given term and favorable AROM and pitocin for IOL Epidural, IV pain meds prn pain Expect SVD  Ruth Lin  Bovard-Stuckert 08/10/2018, 8:44 PM

## 2018-08-11 ENCOUNTER — Inpatient Hospital Stay (HOSPITAL_COMMUNITY)
Admission: RE | Admit: 2018-08-11 | Discharge: 2018-08-13 | DRG: 806 | Disposition: A | Discharge status: Home / Self Care | Payer: Managed Care, Other (non HMO) | Attending: Obstetrics and Gynecology | Admitting: Obstetrics and Gynecology

## 2018-08-11 ENCOUNTER — Inpatient Hospital Stay (HOSPITAL_COMMUNITY): Payer: Managed Care, Other (non HMO) | Admitting: Anesthesiology

## 2018-08-11 ENCOUNTER — Encounter (HOSPITAL_COMMUNITY): Payer: Self-pay

## 2018-08-11 DIAGNOSIS — O2243 Hemorrhoids in pregnancy, third trimester: Secondary | ICD-10-CM | POA: Diagnosis present

## 2018-08-11 DIAGNOSIS — Z3A39 39 weeks gestation of pregnancy: Secondary | ICD-10-CM | POA: Diagnosis not present

## 2018-08-11 DIAGNOSIS — Z3483 Encounter for supervision of other normal pregnancy, third trimester: Secondary | ICD-10-CM

## 2018-08-11 LAB — CBC
HCT: 29.9 % — ABNORMAL LOW (ref 36.0–46.0)
HEMOGLOBIN: 9.8 g/dL — AB (ref 12.0–15.0)
MCH: 27.5 pg (ref 26.0–34.0)
MCHC: 32.8 g/dL (ref 30.0–36.0)
MCV: 83.8 fL (ref 80.0–100.0)
PLATELETS: 215 10*3/uL (ref 150–400)
RBC: 3.57 MIL/uL — AB (ref 3.87–5.11)
RDW: 14.1 % (ref 11.5–15.5)
WBC: 7.7 10*3/uL (ref 4.0–10.5)

## 2018-08-11 LAB — TYPE AND SCREEN
ABO/RH(D): A POS
ANTIBODY SCREEN: NEGATIVE

## 2018-08-11 LAB — RPR: RPR: NONREACTIVE

## 2018-08-11 MED ORDER — ACETAMINOPHEN 325 MG PO TABS
650.0000 mg | ORAL_TABLET | ORAL | Status: DC | PRN
  Administered 2018-08-12 – 2018-08-13 (×2): 650 mg via ORAL
  Filled 2018-08-11 (×2): qty 2

## 2018-08-11 MED ORDER — OXYTOCIN 40 UNITS IN LACTATED RINGERS INFUSION - SIMPLE MED
1.0000 m[IU]/min | INTRAVENOUS | Status: DC
  Administered 2018-08-11: 2 m[IU]/min via INTRAVENOUS
  Filled 2018-08-11: qty 1000

## 2018-08-11 MED ORDER — PHENYLEPHRINE 40 MCG/ML (10ML) SYRINGE FOR IV PUSH (FOR BLOOD PRESSURE SUPPORT)
80.0000 ug | PREFILLED_SYRINGE | INTRAVENOUS | Status: DC | PRN
  Filled 2018-08-11: qty 10
  Filled 2018-08-11: qty 5

## 2018-08-11 MED ORDER — IBUPROFEN 600 MG PO TABS
600.0000 mg | ORAL_TABLET | Freq: Four times a day (QID) | ORAL | Status: DC
  Administered 2018-08-11 – 2018-08-13 (×8): 600 mg via ORAL
  Filled 2018-08-11 (×8): qty 1

## 2018-08-11 MED ORDER — OXYCODONE-ACETAMINOPHEN 5-325 MG PO TABS: 1.0000 | ORAL_TABLET | ORAL | Status: DC | PRN

## 2018-08-11 MED ORDER — LACTATED RINGERS IV SOLN: 500.0000 mL | INTRAVENOUS | Status: DC | PRN

## 2018-08-11 MED ORDER — LACTATED RINGERS IV SOLN
INTRAVENOUS | Status: DC
  Administered 2018-08-11 (×2): via INTRAVENOUS

## 2018-08-11 MED ORDER — ONDANSETRON HCL 4 MG/2ML IJ SOLN: 4.0000 mg | INTRAMUSCULAR | Status: DC | PRN

## 2018-08-11 MED ORDER — POLYETHYLENE GLYCOL 3350 17 G PO PACK
17.0000 g | PACK | Freq: Every day | ORAL | Status: DC | PRN
  Filled 2018-08-11: qty 1

## 2018-08-11 MED ORDER — WITCH HAZEL-GLYCERIN EX PADS
1.0000 "application " | MEDICATED_PAD | CUTANEOUS | Status: DC | PRN
  Administered 2018-08-12: 1 via TOPICAL

## 2018-08-11 MED ORDER — EPHEDRINE 5 MG/ML INJ
10.0000 mg | INTRAVENOUS | Status: DC | PRN
  Filled 2018-08-11: qty 2

## 2018-08-11 MED ORDER — DIBUCAINE 1 % RE OINT
1.0000 "application " | TOPICAL_OINTMENT | RECTAL | Status: DC | PRN
  Administered 2018-08-12: 1 via RECTAL
  Filled 2018-08-11: qty 28

## 2018-08-11 MED ORDER — MEASLES, MUMPS & RUBELLA VAC ~~LOC~~ INJ
0.5000 mL | INJECTION | Freq: Once | SUBCUTANEOUS | Status: AC
  Administered 2018-08-13: 0.5 mL via SUBCUTANEOUS
  Filled 2018-08-11: qty 0.5

## 2018-08-11 MED ORDER — OXYTOCIN BOLUS FROM INFUSION
500.0000 mL | Freq: Once | INTRAVENOUS | Status: AC
  Administered 2018-08-11: 500 mL via INTRAVENOUS

## 2018-08-11 MED ORDER — PHENYLEPHRINE 40 MCG/ML (10ML) SYRINGE FOR IV PUSH (FOR BLOOD PRESSURE SUPPORT)
80.0000 ug | PREFILLED_SYRINGE | INTRAVENOUS | Status: DC | PRN
  Administered 2018-08-11: 80 ug via INTRAVENOUS
  Filled 2018-08-11: qty 5

## 2018-08-11 MED ORDER — PRENATAL MULTIVITAMIN CH
1.0000 | ORAL_TABLET | Freq: Every day | ORAL | Status: DC
  Administered 2018-08-12 – 2018-08-13 (×2): 1 via ORAL
  Filled 2018-08-11 (×2): qty 1

## 2018-08-11 MED ORDER — ACETAMINOPHEN 325 MG PO TABS: 650.0000 mg | ORAL_TABLET | ORAL | Status: DC | PRN

## 2018-08-11 MED ORDER — LIDOCAINE HCL (PF) 1 % IJ SOLN
30.0000 mL | INTRAMUSCULAR | Status: DC | PRN
  Filled 2018-08-11: qty 30

## 2018-08-11 MED ORDER — ONDANSETRON HCL 4 MG/2ML IJ SOLN: 4.0000 mg | Freq: Four times a day (QID) | INTRAMUSCULAR | Status: DC | PRN

## 2018-08-11 MED ORDER — LACTATED RINGERS IV SOLN: INTRAVENOUS | Status: DC

## 2018-08-11 MED ORDER — OXYCODONE-ACETAMINOPHEN 5-325 MG PO TABS: 2.0000 | ORAL_TABLET | ORAL | Status: DC | PRN

## 2018-08-11 MED ORDER — BUTORPHANOL TARTRATE 1 MG/ML IJ SOLN: 1.0000 mg | INTRAMUSCULAR | Status: DC | PRN

## 2018-08-11 MED ORDER — SIMETHICONE 80 MG PO CHEW: 80.0000 mg | CHEWABLE_TABLET | ORAL | Status: DC | PRN

## 2018-08-11 MED ORDER — BENZOCAINE-MENTHOL 20-0.5 % EX AERO
1.0000 "application " | INHALATION_SPRAY | CUTANEOUS | Status: DC | PRN
  Administered 2018-08-12: 1 via TOPICAL
  Filled 2018-08-11: qty 56

## 2018-08-11 MED ORDER — TERBUTALINE SULFATE 1 MG/ML IJ SOLN
0.2500 mg | Freq: Once | INTRAMUSCULAR | Status: DC | PRN
  Filled 2018-08-11: qty 1

## 2018-08-11 MED ORDER — SENNOSIDES-DOCUSATE SODIUM 8.6-50 MG PO TABS
2.0000 | ORAL_TABLET | ORAL | Status: DC
  Administered 2018-08-11 – 2018-08-13 (×2): 2 via ORAL
  Filled 2018-08-11 (×2): qty 2

## 2018-08-11 MED ORDER — SOD CITRATE-CITRIC ACID 500-334 MG/5ML PO SOLN: 30.0000 mL | ORAL | Status: DC | PRN

## 2018-08-11 MED ORDER — LACTATED RINGERS IV SOLN
500.0000 mL | Freq: Once | INTRAVENOUS | Status: AC
  Administered 2018-08-11: 500 mL via INTRAVENOUS

## 2018-08-11 MED ORDER — ONDANSETRON HCL 4 MG PO TABS: 4.0000 mg | ORAL_TABLET | ORAL | Status: DC | PRN

## 2018-08-11 MED ORDER — COCONUT OIL OIL: 1.0000 "application " | TOPICAL_OIL | Status: DC | PRN

## 2018-08-11 MED ORDER — ZOLPIDEM TARTRATE 5 MG PO TABS: 5.0000 mg | ORAL_TABLET | Freq: Every evening | ORAL | Status: DC | PRN

## 2018-08-11 MED ORDER — LIDOCAINE HCL (PF) 1 % IJ SOLN
INTRAMUSCULAR | Status: DC | PRN
  Administered 2018-08-11 (×2): 5 mL via EPIDURAL

## 2018-08-11 MED ORDER — DIPHENHYDRAMINE HCL 50 MG/ML IJ SOLN: 12.5000 mg | INTRAMUSCULAR | Status: DC | PRN

## 2018-08-11 MED ORDER — DIPHENHYDRAMINE HCL 25 MG PO CAPS: 25.0000 mg | ORAL_CAPSULE | Freq: Four times a day (QID) | ORAL | Status: DC | PRN

## 2018-08-11 MED ORDER — HYDROCORTISONE 1 % EX CREA
1.0000 "application " | TOPICAL_CREAM | Freq: Four times a day (QID) | CUTANEOUS | Status: DC | PRN
  Filled 2018-08-11: qty 28

## 2018-08-11 MED ORDER — OXYTOCIN 40 UNITS IN LACTATED RINGERS INFUSION - SIMPLE MED: 2.5000 [IU]/h | INTRAVENOUS | Status: DC

## 2018-08-11 MED ORDER — FENTANYL 2.5 MCG/ML BUPIVACAINE 1/10 % EPIDURAL INFUSION (WH - ANES)
14.0000 mL/h | INTRAMUSCULAR | Status: DC | PRN
  Administered 2018-08-11: 14 mL/h via EPIDURAL
  Filled 2018-08-11: qty 100

## 2018-08-11 NOTE — Progress Notes (Signed)
Patient ID: Ruth Lin, female   DOB: [birth year], 22 y.o.   MRN: [id number]   Pt comfortable with epidural  AFVSS gen NAD FHTs 120's, mod var, + variable/early decel, category 1-2 toco q  SVE 5/90/-1  IUPC placed, FSE placed  T/C Amniinfusion Cont IOL Expect SVD

## 2018-08-11 NOTE — Anesthesia Preprocedure Evaluation (Signed)

## 2018-08-11 NOTE — Progress Notes (Signed)
Patient ID: Ruth Lin, female   DOB: 10/30/1995, 22 y.o.   MRN: [id number]   Reviewed H&P, no changes  AFVSS gen NAD FHTs 140's, mod var, + accels, category 1 toco q  SVE 3/30/-2, vtx AROM -w/o diff/comp, clear fluid  Continue IOL  Pitocin to augment Epidural placed

## 2018-08-11 NOTE — Lactation Note (Signed)
This note was copied from a baby's chart. Lactation Consultation Note  Patient Name: Ruth Lin [id number] Date: 08/11/2018 Reason for consult: Initial assessment;Term  8 hours old FT female who is being exclusively BF by his mother, she's a P3 and experienced BF. She was able to BF her first child for 36 months and her second one for 15 months. Mom participated in the [facility] program at the [facility], and already knows how to hand express; she noticed some colostrum prior birth when she was still pregnant. She has a Medela DEBP at home.  Family was in the dark when entering the room, offered assistance with latch but mom voiced that baby has been taken out of the room for a screening. Asked mom to call for assistance when needed. Per mom feedings at the breast are comfortable and she has been able to hear baby swallowing when at the breast. Cluster feeding was discussed.   Feeding plan:  1. Encouraged mom to feed baby STS 8-12 times/24 hours or sooner if feeding cues are present 2. Hand expression and spoon feeding was also encouraged  BF brochure, BF resources and feeding diary were reviewed. Mom reported all questions and concerns were answered, she's aware of LC services and will call PRN.  Maternal Data Formula Feeding for Exclusion: No Has patient been taught Hand Expression?: Yes Does the patient have breastfeeding experience prior to this delivery?: Yes  Feeding Feeding Type: Breast Fed    Interventions Interventions: Breast feeding basics reviewed  Lactation Tools Discussed/Used WIC Program: Yes   Consult Status Consult Status: PRN Follow-up type: In-patient    Daielle Melcher Venetia Constable 08/11/2018, 10:57 PM

## 2018-08-11 NOTE — Anesthesia Pain Management Evaluation Note (Signed)
  CRNA Pain Management Visit Note  Patient: Ruth Lin, 22 y.o., female  "Hello I am a member of the anesthesia team at [facility]. We have an anesthesia team available at all times to provide care throughout the hospital, including epidural management and anesthesia for C-section. I don't know your plan for the delivery whether it a natural birth, water birth, IV sedation, nitrous supplementation, doula or epidural, but we want to meet your pain goals."   1.Was your pain managed to your expectations on prior hospitalizations?   Yes   2.What is your expectation for pain management during this hospitalization?     Epidural  3.How can we help you reach that goal? Epidural in place and is working well.  Record the patient's initial score and the patient's pain goal.   Pain: 0  Pain Goal: 4 [facility] wants you to be able to say your pain was always managed very well.  Ruth Lin 08/11/2018

## 2018-08-11 NOTE — Anesthesia Procedure Notes (Signed)
Epidural Patient location during procedure: OB  Staffing Anesthesiologist: Calise Dunckel, MD Performed: anesthesiologist   Preanesthetic Checklist Completed: patient identified, site marked, surgical consent, pre-op evaluation, timeout performed, IV checked, risks and benefits discussed and monitors and equipment checked  Epidural Patient position: sitting Prep: DuraPrep Patient monitoring: heart rate, continuous pulse ox and blood pressure Approach: right paramedian Location: L3-L4 Injection technique: LOR saline  Needle:  Needle type: Tuohy  Needle gauge: 17 G Needle length: 9 cm and 9 Needle insertion depth: 6 cm Catheter type: closed end flexible Catheter size: 20 Guage Catheter at skin depth: 10 cm Test dose: negative  Assessment Events: blood not aspirated, injection not painful, no injection resistance, negative IV test and no paresthesia  Additional Notes Patient identified. Risks/Benefits/Options discussed with patient including but not limited to bleeding, infection, nerve damage, paralysis, failed block, incomplete pain control, headache, blood pressure changes, nausea, vomiting, reactions to medication both or allergic, itching and postpartum back pain. Confirmed with bedside nurse the patient's most recent platelet count. Confirmed with patient that they are not currently taking any anticoagulation, have any bleeding history or any family history of bleeding disorders. Patient expressed understanding and wished to proceed. All questions were answered. Sterile technique was used throughout the entire procedure. Please see nursing notes for vital signs. Test dose was given through epidural needle and negative prior to continuing to dose epidural or start infusion. Warning signs of high block given to the patient including shortness of breath, tingling/numbness in hands, complete motor block, or any concerning symptoms with instructions to call for help. Patient was given  instructions on fall risk and not to get out of bed. All questions and concerns addressed with instructions to call with any issues.     

## 2018-08-12 LAB — CBC
HEMATOCRIT: 25.4 % — AB (ref 36.0–46.0)
Hemoglobin: 8.5 g/dL — ABNORMAL LOW (ref 12.0–15.0)
MCH: 28 pg (ref 26.0–34.0)
MCHC: 33.5 g/dL (ref 30.0–36.0)
MCV: 83.6 fL (ref 80.0–100.0)
PLATELETS: 193 10*3/uL (ref 150–400)
RBC: 3.04 MIL/uL — ABNORMAL LOW (ref 3.87–5.11)
RDW: 14.2 % (ref 11.5–15.5)
WBC: 9.6 10*3/uL (ref 4.0–10.5)
nRBC: 0 % (ref 0.0–0.2)

## 2018-08-12 MED ORDER — OXYCODONE HCL 5 MG PO TABS
5.0000 mg | ORAL_TABLET | Freq: Once | ORAL | Status: AC
  Administered 2018-08-12: 5 mg via ORAL
  Filled 2018-08-12: qty 1

## 2018-08-12 NOTE — Anesthesia Postprocedure Evaluation (Signed)
Anesthesia Post Note  Patient: Ruth Lin  Procedure(s) Performed: AN AD HOC LABOR EPIDURAL     Patient location during evaluation: Mother Baby Anesthesia Type: Epidural Level of consciousness: awake and alert Pain management: pain level controlled Vital Signs Assessment: post-procedure vital signs reviewed and stable Respiratory status: spontaneous breathing, nonlabored ventilation and respiratory function stable Cardiovascular status: stable Postop Assessment: no headache, no backache, epidural receding, able to ambulate, adequate PO intake, no apparent nausea or vomiting and patient able to bend at knees Anesthetic complications: no    Last Vitals:  Vitals:   08/12/18 0126 08/12/18 0532  BP: 108/63 111/69  Pulse: 73 65  Resp: 16 18  Temp: 37 C 36.8 C  SpO2:      Last Pain:  Vitals:   08/12/18 0532  TempSrc: Oral  PainSc: 1    Pain Goal:                 [place]

## 2018-08-12 NOTE — Progress Notes (Signed)
Post Partum Day 1 Subjective: up ad lib, voiding, tolerating PO, + flatus and bonding well with baby - breastfeeding. She reports back pain not well controlled with ibuprofen and tylenol. She reports discomfort with hemorrhoids. Lochia mild.  Objective: Blood pressure 111/69, pulse 65, temperature 98.3 F (36.8 C), temperature source Oral, resp. rate 18, height 5' (1.524 m), weight 78.7 kg, last menstrual period 11/09/2017, SpO2 99 %, unknown if currently breastfeeding.  Physical Exam:  General: alert, cooperative and no distress Lochia: appropriate Uterine Fundus: firm Incision: n/a DVT Evaluation: No evidence of DVT seen on physical exam. No significant calf/ankle edema.  Recent Labs    08/11/18 0753 08/12/18 0529  HGB 9.8* 8.5*  HCT 29.9* 25.4*    Assessment/Plan: Plan for discharge tomorrow  Pain control prn - oxycodone Reviewed care for hemorrhoids   LOS: 1 day   Paul Torpey W Mckala Pantaleon 08/12/2018, 9:42 AM

## 2018-08-12 NOTE — Lactation Note (Signed)
This note was copied from a baby's chart. Lactation Consultation Note  Patient Name: Ruth Lin [id number] Date: 08/12/2018 Reason for consult: Follow-up assessment;Term   Follow up with mom of 25 hour old infant. Infant with 17 BF for 10-30 minutes, 2 BF attempts, 3 voids and 5 stools in the last 24 hours. Infant weight 7 pounds 15.9 ounces with 2% weight loss since birth. LATCH scores 6-9.   Mom reports infant is feeding well. Mom reports she is feeling fuller today. Mom reports nipple pain at the beginning of the feeding that improves with feeding. Reviewed STS, awakening techniques and Massage/compress breast with feeding.   Mom reports she has no questions/concerns at this time, mom to call out for assistance as needed.    Maternal Data Formula Feeding for Exclusion: No Has patient been taught Hand Expression?: Yes Does the patient have breastfeeding experience prior to this delivery?: Yes  Feeding Feeding Type: Breast Fed  LATCH Score                   Interventions Interventions: Breast compression;Hand express;Breast massage;Skin to skin  Lactation Tools Discussed/Used WIC Program: Yes   Consult Status Consult Status: Follow-up Follow-up type: Call as needed    Ed Blalock 08/12/2018, 3:46 PM

## 2018-08-13 LAB — BIRTH TISSUE RECOVERY COLLECTION (PLACENTA DONATION)

## 2018-08-13 MED ORDER — IBUPROFEN 200 MG PO TABS: 600.0000 mg | ORAL_TABLET | Freq: Four times a day (QID) | ORAL | 0 refills | Status: DC | PRN

## 2018-08-13 MED ORDER — ACETAMINOPHEN 325 MG PO TABS: 650.0000 mg | ORAL_TABLET | ORAL | 0 refills | Status: DC | PRN

## 2018-08-13 NOTE — Lactation Note (Signed)
This note was copied from a baby's chart. Lactation Consultation Note  Patient Name: Boy Timya Trimmer [id number] Date: 08/13/2018   Infant is 63 hours old & is cluster-feeding. Mom was assisted with minor position changes & breast compressions to increase frequency of swallows. Mom reports that her milk usually comes to volume when "she gets home." Mom reports "lots" of voids and stools (7 voids & 7 stools noted since birth).   Mom has a Medela pump at home. Initiation of pumping (in about 2 week's time unless there is engorgement or separation of mother and baby) discussed.   Mom's nipples noted to be atraumatic. Mom's breasts are filling (her R breast is currently heavier than her L).   Lurline Hare [facility] 08/13/2018, 8:15 AM

## 2018-08-13 NOTE — Progress Notes (Signed)
Post Partum Day 2 Subjective: no complaints, up ad lib and tolerating PO  Objective: Blood pressure 110/70, pulse 69, temperature 98.4 F (36.9 C), temperature source Oral, resp. rate 18, height 5' (1.524 m), weight 78.7 kg, last menstrual period 11/09/2017, SpO2 99 %, unknown if currently breastfeeding.  Physical Exam:  General: alert and cooperative Lochia: appropriate Uterine Fundus: firm   Recent Labs    08/11/18 0753 08/12/18 0529  HGB 9.8* 8.5*  HCT 29.9* 25.4*    Assessment/Plan: Discharge home  Considering nexplanon   LOS: 2 days   Oliver Pila 08/13/2018, 9:27 AM

## 2018-08-13 NOTE — Discharge Summary (Signed)
OB Discharge Summary     Patient Name: Ruth Lin DOB: September 25, 1996 MRN: [id number]  Date of admission: 08/11/2018 Delivering MD: Sherian Rein   Date of discharge: 08/13/2018  Admitting diagnosis: INDUCTION Intrauterine pregnancy: [redacted]w[redacted]d     Secondary diagnosis:  Principal Problem:   SVD (spontaneous vaginal delivery) Active Problems:   Normal pregnancy in multigravida in third trimester  Additional problems: none     Discharge diagnosis: Term Pregnancy Delivered                                                                                                Post partum procedures:none  Augmentation: AROM and Pitocin  Complications: None  Hospital course:  Induction of Labor With Vaginal Delivery   22 y.o. yo G3P2002 at [redacted]w[redacted]d was admitted to the hospital 08/11/2018 for induction of labor.  Indication for induction: Favorable cervix at term.  Patient had an uncomplicated labor course as follows: Membrane Rupture Time/Date: 9:22 AM ,08/11/2018   Intrapartum Procedures: Episiotomy: None [1]                                         Lacerations:  Labial [10]  Patient had delivery of a Viable infant.  Information for the patient's newborn:  Khaya, Theissen [[id number]]  Delivery Method: Vaginal, Spontaneous(Filed from Delivery Summary)   08/11/2018  Details of delivery can be found in separate delivery note.  Patient had a routine postpartum course. Patient is discharged home 08/13/18.  Physical exam  Vitals:   08/12/18 0532 08/12/18 1849 08/13/18 0006 08/13/18 0600  BP: 111/69 (!) 118/59 122/68 110/70  Pulse: 65 67 70 69  Resp: 18 18 19 18   Temp: 98.3 F (36.8 C) 98.3 F (36.8 C) 98.5 F (36.9 C) 98.4 F (36.9 C)  TempSrc: Oral Oral Oral Oral  SpO2:    99%  Weight:      Height:       General: alert and cooperative Lochia: appropriate Uterine Fundus: firm  Labs: Lab Results  Component Value Date   WBC 9.6 08/12/2018   HGB 8.5 (L) 08/12/2018   HCT 25.4 (L)  08/12/2018   MCV 83.6 08/12/2018   PLT 193 08/12/2018   CMP Latest Ref Rng & Units 01/24/2017  Glucose 65 - 99 mg/dL 96  BUN 6 - 20 mg/dL 17  Creatinine 7.82 - 9.56 mg/dL 2.13  Sodium 086 - 578 mmol/L 138  Potassium 3.5 - 5.1 mmol/L 3.8  Chloride 101 - 111 mmol/L 103  CO2 22 - 32 mmol/L 27  Calcium 8.9 - 10.3 mg/dL 4.6(N)  Total Protein 6.0 - 8.3 g/dL -  Total Bilirubin 0.3 - 1.2 mg/dL -  Alkaline Phos 39 - 629 U/L -  AST 0 - 37 U/L -  ALT 0 - 35 U/L -    Discharge instruction: per After Visit Summary and "Baby and Me Booklet".  After visit meds:  Allergies as of 08/13/2018   No Known Allergies     Medication List  TAKE these medications   acetaminophen 325 MG tablet Commonly known as:  TYLENOL Take 2 tablets (650 mg total) by mouth every 4 (four) hours as needed (30). What changed:    when to take this  reasons to take this   ibuprofen 200 MG tablet Commonly known as:  ADVIL,MOTRIN Take 3 tablets (600 mg total) by mouth every 6 (six) hours as needed.   polyethylene glycol packet Commonly known as:  MIRALAX / GLYCOLAX Take 17 g by mouth daily as needed for mild constipation.   PRENATAL ADULT GUMMY/DHA/FA PO Take 2 each by mouth daily.   PREPARATION H 1 % Generic drug:  hydrocortisone cream Apply 1 application topically as needed for itching (hemorrhoids).       Diet: routine diet  Activity: Advance as tolerated. Pelvic rest for 6 weeks.   Outpatient follow up:6 weeks Follow up Appt:No future appointments. Follow up Visit:No follow-ups on file.  Postpartum contraception: Nexplanon  Newborn Data: Live born female  Birth Weight: 8 lb 2.5 oz (3700 g) APGAR: 9, 9  Newborn Delivery   Birth date/time:  08/11/2018 14:29:00 Delivery type:  Vaginal, Spontaneous     Baby Feeding: Breast Disposition:home with mother   Declines circumcision   08/13/2018 Oliver Pila, MD

## 2019-12-10 ENCOUNTER — Ambulatory Visit (HOSPITAL_COMMUNITY)
Admission: EM | Admit: 2019-12-10 | Discharge: 2019-12-10 | Disposition: A | Discharge status: Home / Self Care | Payer: Managed Care, Other (non HMO)

## 2019-12-10 ENCOUNTER — Other Ambulatory Visit: Payer: Self-pay

## 2019-12-10 ENCOUNTER — Encounter (HOSPITAL_COMMUNITY): Payer: Self-pay | Admitting: Emergency Medicine

## 2019-12-10 DIAGNOSIS — F3289 Other specified depressive episodes: Secondary | ICD-10-CM

## 2019-12-10 DIAGNOSIS — K130 Diseases of lips: Secondary | ICD-10-CM | POA: Diagnosis not present

## 2019-12-10 DIAGNOSIS — F411 Generalized anxiety disorder: Secondary | ICD-10-CM | POA: Diagnosis not present

## 2019-12-10 MED ORDER — HYDROXYZINE HCL 10 MG PO TABS: 10.0000 mg | ORAL_TABLET | Freq: Four times a day (QID) | ORAL | 0 refills | Status: DC

## 2019-12-10 MED ORDER — SERTRALINE HCL 25 MG PO TABS: 25.0000 mg | ORAL_TABLET | Freq: Every day | ORAL | 0 refills | Status: DC

## 2019-12-10 NOTE — Discharge Instructions (Signed)
Take hydroxyzine as needed for anxiety attacks.  Take sertraline 25mg  at night.  Follow up with PCP within a month. Set up therapy with behavioral health.  Report to ER with thoughts of hurting yourself or others.

## 2019-12-10 NOTE — ED Triage Notes (Addendum)
Pt c/o blister on lower lip since yesterday morning. States she bit her lower lip twice yesterday, and something "popped and turned purple". States she also took C4 this morning during her workout and it made her feel jittery. Pt states "I had little red dots on my arm". Pt also c/o depression. Denies SI/HI.

## 2019-12-10 NOTE — ED Provider Notes (Signed)
[facility]    CSN: [id number] Arrival date & time: 12/10/19  1551      History   Chief Complaint Chief Complaint  Patient presents with  . Blister  . Rash  . Jittery  . Depression    HPI Ruth Lin is a 24 y.o. female.   Reports that she is concerned about a blister in her mouth.  It is her inner lower left lip.  Reports that she bit her lip by accident yesterday.  Main concern today is depression and anxiety.  She has history of postpartum depression anxiety and has a 55-month-old baby.  Her oldest child is 55 years old.  Reports that she has taken sertraline in the past with success, not currently taking any medications to manage anxiety or depression.  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  Reports feelings of being overwhelmed and anxious very often.  Reports that she is sleeping at night, and that her kids are sleeping at night.  Reports intrusive thoughts.  Denies homicidal, suicidal ideation at this time.  Denies headache, fever, sore throat, shortness of breath, rash, chest pain, other symptoms.   ROS as per HPI  The history is provided by the patient.    Past Medical History:  Diagnosis Date  . Anemia   . Hemorrhoid   . SVD (spontaneous vaginal delivery) 08/11/2018    Patient Active Problem List   Diagnosis Date Noted  . Normal pregnancy in multigravida in third trimester 08/11/2018  . SVD (spontaneous vaginal delivery) 08/11/2018    Past Surgical History:  Procedure Laterality Date  . NO PAST SURGERIES    . WISDOM TOOTH EXTRACTION Bilateral 2017    OB History    Gravida  3   Para  2   Term  2   Preterm      AB      Living  2     SAB      TAB      Ectopic      Multiple  0   Live Births  2            Home Medications    Prior to Admission medications   Medication Sig Start Date End Date Taking? Authorizing Provider  IUD'S IU by Intrauterine route.   Yes [provider]  acetaminophen  (TYLENOL) 325 MG tablet Take 2 tablets (650 mg total) by mouth every 4 (four) hours as needed (30). 08/13/18   Huel Cote, MD  hydrocortisone cream (PREPARATION H) 1 % Apply 1 application topically as needed for itching (hemorrhoids).    [provider]  hydrOXYzine (ATARAX/VISTARIL) 10 MG tablet Take 1 tablet (10 mg total) by mouth every 6 (six) hours. 12/10/19   Moshe Cipro, NP  ibuprofen (MOTRIN IB) 200 MG tablet Take 3 tablets (600 mg total) by mouth every 6 (six) hours as needed. 08/13/18   Huel Cote, MD  polyethylene glycol [facility] Ethelene Hal) packet Take 17 g by mouth daily as needed for mild constipation.    [provider]  Prenatal MV & Min w/FA-DHA (PRENATAL ADULT GUMMY/DHA/FA PO) Take 2 each by mouth daily.    [provider]  sertraline (ZOLOFT) 25 MG tablet Take 1 tablet (25 mg total) by mouth daily. 12/10/19 01/09/20  Moshe Cipro, NP  spironolactone (ALDACTONE) 50 MG tablet TAKE 1 TABLET BY MOUTH ONCE A DAY TAKE 1 TABLET FOR THE FIRST WEEK, THEN 2 IF TOLERATED 12/03/19   [provider]    Family History Family History  Problem Relation Age of Onset  . Diabetes Maternal Grandfather     Social History Social History   Tobacco Use  . Smoking status: Never Smoker  . Smokeless tobacco: Never Used  Substance Use Topics  . Alcohol use: No  . Drug use: No     Allergies   Patient has no known allergies.   Review of Systems Review of Systems   Physical Exam Triage Vital Signs ED Triage Vitals  Enc Vitals Group     BP 12/10/19 1619 130/80     Pulse Rate 12/10/19 1619 66     Resp 12/10/19 1619 16     Temp 12/10/19 1619 98.1 F (36.7 C)     Temp src --      SpO2 12/10/19 1619 100 %     Weight --      Height --      Head Circumference --      Peak Flow --      Pain Score 12/10/19 1623 0     Pain Loc --      Pain Edu? --      Excl. in GC? --    No data found.  Updated Vital Signs BP 130/80    Pulse 66   Temp 98.1 F (36.7 C)   Resp 16   SpO2 100%   Visual Acuity Right Eye Distance:   Left Eye Distance:   Bilateral Distance:    Right Eye Near:   Left Eye Near:    Bilateral Near:     Physical Exam Vitals and nursing note reviewed.  Constitutional:      General: She is not in acute distress.    Appearance: She is well-developed.  HENT:     Head: Normocephalic and atraumatic.     Nose: Nose normal.     Mouth/Throat:     Mouth: Mucous membranes are moist. Oral lesions present.      Comments: Area of bruising but it is on the inside of the lower lip in this location. Eyes:     Conjunctiva/sclera: Conjunctivae normal.  Cardiovascular:     Rate and Rhythm: Normal rate and regular rhythm.     Heart sounds: No murmur.  Pulmonary:     Effort: Pulmonary effort is normal. No respiratory distress.     Breath sounds: Normal breath sounds.  Abdominal:     Palpations: Abdomen is soft.     Tenderness: There is no abdominal tenderness.  Musculoskeletal:     Cervical back: Neck supple.  Skin:    General: Skin is warm and dry.     Capillary Refill: Capillary refill takes less than 2 seconds.  Neurological:     General: No focal deficit present.     Mental Status: She is alert and oriented to person, place, and time.  Psychiatric:        Mood and Affect: Mood is anxious and depressed.        Behavior: Behavior normal.        Thought Content: Thought content normal.        Cognition and Memory: Cognition and memory normal.      UC Treatments / Results  Labs (all labs ordered are listed, but only abnormal results are displayed) Labs Reviewed - No data to display  EKG   Radiology No results found.  Procedures Procedures (including critical care time)  Medications Ordered in UC Medications - No data to  display  Initial Impression / Assessment and Plan / UC Course  I have reviewed the triage vital signs and the nursing notes.  Pertinent labs & imaging  results that were available during my care of the patient were reviewed by me and considered in my medical decision making (see chart for details).    Presents today for concern of blister on left lower inner lip.  It is bruised, with mild laceration that is healing.  No concerns for infection.  Patient has a history of postpartum depression and anxiety.  Instructed to get set up with with our behavioral health for CBT, sertraline and hydroxyzine prescribed today.  Instructed that this will be the only prescriptions that she will receive from [place], that she will have to receive the rest from her primary care physician.  Instructed to follow-up with him within a month.  Instructed on when to go to the ED. Final Clinical Impressions(s) / UC Diagnoses   Final diagnoses:  Anxiety state  Other depression     Discharge Instructions     Take hydroxyzine as needed for anxiety attacks.  Take sertraline 25mg  at night.  Follow up with PCP within a month. Set up therapy with behavioral health.  Report to ER with thoughts of hurting yourself or others.      ED Prescriptions    Medication Sig Dispense Auth. Provider   hydrOXYzine (ATARAX/VISTARIL) 10 MG tablet Take 1 tablet (10 mg total) by mouth every 6 (six) hours. 30 tablet Faustino Congress, NP   sertraline (ZOLOFT) 25 MG tablet Take 1 tablet (25 mg total) by mouth daily. 30 tablet Faustino Congress, NP     I have reviewed the PDMP during this encounter.   Faustino Congress, NP 12/10/19 1734

## 2020-04-20 DIAGNOSIS — Z419 Encounter for procedure for purposes other than remedying health state, unspecified: Secondary | ICD-10-CM | POA: Diagnosis not present

## 2020-05-17 DIAGNOSIS — N3001 Acute cystitis with hematuria: Secondary | ICD-10-CM | POA: Diagnosis not present

## 2020-05-17 DIAGNOSIS — Z113 Encounter for screening for infections with a predominantly sexual mode of transmission: Secondary | ICD-10-CM | POA: Diagnosis not present

## 2020-05-17 DIAGNOSIS — R350 Frequency of micturition: Secondary | ICD-10-CM | POA: Diagnosis not present

## 2020-05-21 DIAGNOSIS — Z419 Encounter for procedure for purposes other than remedying health state, unspecified: Secondary | ICD-10-CM | POA: Diagnosis not present

## 2020-06-14 DIAGNOSIS — N911 Secondary amenorrhea: Secondary | ICD-10-CM | POA: Diagnosis not present

## 2020-06-14 DIAGNOSIS — Z3201 Encounter for pregnancy test, result positive: Secondary | ICD-10-CM | POA: Diagnosis not present

## 2020-06-21 DIAGNOSIS — Z419 Encounter for procedure for purposes other than remedying health state, unspecified: Secondary | ICD-10-CM | POA: Diagnosis not present

## 2020-07-03 DIAGNOSIS — Z3A14 14 weeks gestation of pregnancy: Secondary | ICD-10-CM | POA: Diagnosis not present

## 2020-07-03 DIAGNOSIS — O218 Other vomiting complicating pregnancy: Secondary | ICD-10-CM | POA: Diagnosis not present

## 2020-07-03 DIAGNOSIS — Z3A08 8 weeks gestation of pregnancy: Secondary | ICD-10-CM | POA: Diagnosis not present

## 2020-07-03 DIAGNOSIS — Z369 Encounter for antenatal screening, unspecified: Secondary | ICD-10-CM | POA: Diagnosis not present

## 2020-07-03 DIAGNOSIS — O26891 Other specified pregnancy related conditions, first trimester: Secondary | ICD-10-CM | POA: Diagnosis not present

## 2020-07-03 DIAGNOSIS — Z113 Encounter for screening for infections with a predominantly sexual mode of transmission: Secondary | ICD-10-CM | POA: Diagnosis not present

## 2020-07-03 DIAGNOSIS — N898 Other specified noninflammatory disorders of vagina: Secondary | ICD-10-CM | POA: Diagnosis not present

## 2020-07-03 DIAGNOSIS — O30041 Twin pregnancy, dichorionic/diamniotic, first trimester: Secondary | ICD-10-CM | POA: Diagnosis not present

## 2020-07-03 DIAGNOSIS — Z3A09 9 weeks gestation of pregnancy: Secondary | ICD-10-CM | POA: Diagnosis not present

## 2020-07-03 DIAGNOSIS — O418X99 Other specified disorders of amniotic fluid and membranes, unspecified trimester, other fetus: Secondary | ICD-10-CM | POA: Diagnosis not present

## 2020-07-03 LAB — OB RESULTS CONSOLE ABO/RH: RH Type: POSITIVE

## 2020-07-03 LAB — OB RESULTS CONSOLE RPR: RPR: NONREACTIVE

## 2020-07-03 LAB — OB RESULTS CONSOLE ANTIBODY SCREEN: Antibody Screen: NEGATIVE

## 2020-07-03 LAB — OB RESULTS CONSOLE HIV ANTIBODY (ROUTINE TESTING): HIV: NONREACTIVE

## 2020-07-03 LAB — OB RESULTS CONSOLE RUBELLA ANTIBODY, IGM: Rubella: IMMUNE

## 2020-07-03 LAB — OB RESULTS CONSOLE GC/CHLAMYDIA
Chlamydia: NEGATIVE
Gonorrhea: NEGATIVE

## 2020-07-03 LAB — OB RESULTS CONSOLE HEPATITIS B SURFACE ANTIGEN: Hepatitis B Surface Ag: NEGATIVE

## 2020-07-21 DIAGNOSIS — Z419 Encounter for procedure for purposes other than remedying health state, unspecified: Secondary | ICD-10-CM | POA: Diagnosis not present

## 2020-08-21 DIAGNOSIS — Z419 Encounter for procedure for purposes other than remedying health state, unspecified: Secondary | ICD-10-CM | POA: Diagnosis not present

## 2020-09-20 DIAGNOSIS — Z419 Encounter for procedure for purposes other than remedying health state, unspecified: Secondary | ICD-10-CM | POA: Diagnosis not present

## 2020-10-21 DIAGNOSIS — Z419 Encounter for procedure for purposes other than remedying health state, unspecified: Secondary | ICD-10-CM | POA: Diagnosis not present

## 2020-11-21 DIAGNOSIS — Z419 Encounter for procedure for purposes other than remedying health state, unspecified: Secondary | ICD-10-CM | POA: Diagnosis not present

## 2020-12-19 DIAGNOSIS — Z419 Encounter for procedure for purposes other than remedying health state, unspecified: Secondary | ICD-10-CM | POA: Diagnosis not present

## 2021-01-11 ENCOUNTER — Encounter (HOSPITAL_COMMUNITY): Payer: Self-pay

## 2021-01-11 ENCOUNTER — Encounter (HOSPITAL_COMMUNITY): Payer: Self-pay | Admitting: *Deleted

## 2021-01-11 NOTE — Patient Instructions (Signed)
Ruth Lin  01/11/2021   Your procedure is scheduled on:  01/15/2021  Arrive at 0530 at Entrance C on [address] at [facility]  and [employer]. You are invited to use the FREE valet parking or use the Visitor's parking deck.  Pick up the phone at the desk and dial [phone].  Call this number if you have problems the morning of surgery: [phone]  Remember:   Do not eat food:(After Midnight) Desps de medianoche.  Do not drink clear liquids: (After Midnight) Desps de medianoche.  Take these medicines the morning of surgery with A SIP OF WATER:  none   Do not wear jewelry, make-up or nail polish.  Do not wear lotions, powders, or perfumes. Do not wear deodorant.  Do not shave 48 hours prior to surgery.  Do not bring valuables to the hospital.  [facility] is not   responsible for any belongings or valuables brought to the hospital.  Contacts, dentures or bridgework may not be worn into surgery.  Leave suitcase in the car. After surgery it may be brought to your room.  For patients admitted to the hospital, checkout time is 11:00 AM the day of              discharge.      Please read over the following fact sheets that you were given:     Preparing for Surgery

## 2021-01-12 ENCOUNTER — Encounter (HOSPITAL_COMMUNITY)
Admission: RE | Admit: 2021-01-12 | Discharge: 2021-01-12 | Disposition: A | Discharge status: Home / Self Care | Payer: Managed Care, Other (non HMO) | Source: Ambulatory Visit | Attending: Obstetrics and Gynecology | Admitting: Obstetrics and Gynecology

## 2021-01-12 ENCOUNTER — Other Ambulatory Visit (HOSPITAL_COMMUNITY)
Admission: RE | Admit: 2021-01-12 | Discharge: 2021-01-12 | Disposition: A | Discharge status: Home / Self Care | Payer: Managed Care, Other (non HMO) | Source: Ambulatory Visit | Attending: Obstetrics and Gynecology | Admitting: Obstetrics and Gynecology

## 2021-01-12 ENCOUNTER — Other Ambulatory Visit: Payer: Self-pay

## 2021-01-12 DIAGNOSIS — Z01812 Encounter for preprocedural laboratory examination: Secondary | ICD-10-CM | POA: Insufficient documentation

## 2021-01-12 DIAGNOSIS — Z20822 Contact with and (suspected) exposure to covid-19: Secondary | ICD-10-CM | POA: Insufficient documentation

## 2021-01-12 LAB — CBC
HCT: 25.6 % — ABNORMAL LOW (ref 36.0–46.0)
Hemoglobin: 8.4 g/dL — ABNORMAL LOW (ref 12.0–15.0)
MCH: 25.7 pg — ABNORMAL LOW (ref 26.0–34.0)
MCHC: 32.8 g/dL (ref 30.0–36.0)
MCV: 78.3 fL — ABNORMAL LOW (ref 80.0–100.0)
Platelets: 221 10*3/uL (ref 150–400)
RBC: 3.27 MIL/uL — ABNORMAL LOW (ref 3.87–5.11)
RDW: 14.2 % (ref 11.5–15.5)
WBC: 7.7 10*3/uL (ref 4.0–10.5)
nRBC: 0 % (ref 0.0–0.2)

## 2021-01-12 LAB — SARS CORONAVIRUS 2 (TAT 6-24 HRS): SARS Coronavirus 2: NEGATIVE

## 2021-01-12 LAB — RPR: RPR Ser Ql: NONREACTIVE

## 2021-01-12 LAB — TYPE AND SCREEN
ABO/RH(D): A POS
Antibody Screen: NEGATIVE

## 2021-01-13 NOTE — H&P (Signed)
Ruth Lin is a 25 y.o. female, G4 P49, di/di twin IUP at 37+ weeks with EDC 4-16 presenting for c-section for breech presentation of baby A, IUGR of both twins.  PNC complicated by di/di twins, u/s on 3-23 with IUGR of both babies, nl AFV, BPP 8/8, nl UA dopplers.  Based on ACOG recommendations, being admitted for c-section prior to 38 weeks due to IUGR and baby A breech.  Pregnancy otherwise uncomplicated . OB History    Gravida  4   Para  3   Term  2   Preterm      AB      Living  2     SAB      IAB      Ectopic      Multiple  0   Live Births  2          Past Medical History:  Diagnosis Date  . Anemia   . Hemorrhoid   . SVD (spontaneous vaginal delivery) 08/11/2018   Past Surgical History:  Procedure Laterality Date  . NO PAST SURGERIES    . WISDOM TOOTH EXTRACTION Bilateral 2017   Family History: family history includes Diabetes in her maternal grandfather. Social History:  reports that she has never smoked. She has never used smokeless tobacco. She reports that she does not drink alcohol and does not use drugs.     Maternal Diabetes: No Genetic Screening: Declined Maternal Ultrasounds/Referrals: Normal and IUGR Fetal Ultrasounds or other Referrals:  None Maternal Substance Abuse:  No Significant Maternal Medications:  None Significant Maternal Lab Results:  Group B Strep positive Other Comments:  di/di twins  Review of Systems  Respiratory: Negative.   Cardiovascular: Negative.    Maternal Medical History:  Fetal activity: Perceived fetal activity is normal.    Prenatal complications: IUGR.   Prenatal Complications - Diabetes: none.      unknown if currently breastfeeding. Maternal Exam:  Abdomen: Patient reports no abdominal tenderness. Introitus: Normal vulva. Normal vagina.  Amniotic fluid character: not assessed.  Pelvis: adequate for delivery.      Physical Exam Constitutional:      Appearance: Normal appearance.   Cardiovascular:     Rate and Rhythm: Normal rate and regular rhythm.     Heart sounds: Normal heart sounds. No murmur heard.   Pulmonary:     Effort: Pulmonary effort is normal. No respiratory distress.     Breath sounds: Normal breath sounds. No wheezing.  Abdominal:     Palpations: Abdomen is soft.  Genitourinary:    General: Normal vulva.  Musculoskeletal:     Cervical back: Normal range of motion and neck supple.  Neurological:     Mental Status: She is alert.     Prenatal labs: ABO, Rh: --/--/A POS (03/25 1013) Antibody: NEG (03/25 1013) Rubella: Immune (09/13 0000) RPR: NON REACTIVE (03/25 1006)  HBsAg: Negative (09/13 0000)  HIV: Non-reactive (09/13 0000)  GBS:   positive  Assessment/Plan: Di/di twins at 37+ weeks with IUGR of both twins, baby A breech.  Being admitted for c-section for malpresentation, prior to 38 weeks per ACOG guidelines for IUGR of twins.  C-section procedure and risks discussed, questions answered.  Admit for scheduled c-section   Zenaida Niece 01/13/2021, 7:13 PM

## 2021-01-14 NOTE — Anesthesia Preprocedure Evaluation (Addendum)
Anesthesia Evaluation  Patient identified by MRN, date of birth, ID band Patient awake    Reviewed: Allergy & Precautions, NPO status , Patient's Chart, lab work & pertinent test results  Airway Mallampati: III  TM Distance: >3 FB Neck ROM: Full    Dental  (+) Teeth Intact, Dental Advisory Given   Pulmonary neg pulmonary ROS,    Pulmonary exam normal breath sounds clear to auscultation       Cardiovascular negative cardio ROS Normal cardiovascular exam Rhythm:Regular Rate:Normal     Neuro/Psych negative neurological ROS  negative psych ROS   GI/Hepatic negative GI ROS, Neg liver ROS,   Endo/Other  Obesity   Renal/GU negative Renal ROS     Musculoskeletal negative musculoskeletal ROS (+)   Abdominal   Peds  Hematology  (+) Blood dyscrasia, anemia , Plt 221k   Anesthesia Other Findings   Reproductive/Obstetrics (+) Pregnancy (Breech twins)                            Anesthesia Physical Anesthesia Plan  ASA: III  Anesthesia Plan: Spinal   Post-op Pain Management:    Induction: Intravenous  PONV Risk Score and Plan: 2 and Scopolamine patch - Pre-op, Dexamethasone and Ondansetron  Airway Management Planned: Natural Airway  Additional Equipment:   Intra-op Plan:   Post-operative Plan:   Informed Consent: I have reviewed the patients History and Physical, chart, labs and discussed the procedure including the risks, benefits and alternatives for the proposed anesthesia with the patient or authorized representative who has indicated his/her understanding and acceptance.     Dental advisory given  Plan Discussed with: CRNA  Anesthesia Plan Comments:        Anesthesia Quick Evaluation

## 2021-01-15 ENCOUNTER — Inpatient Hospital Stay (HOSPITAL_COMMUNITY): Payer: Managed Care, Other (non HMO) | Admitting: Anesthesiology

## 2021-01-15 ENCOUNTER — Encounter (HOSPITAL_COMMUNITY): Payer: Self-pay | Admitting: Obstetrics and Gynecology

## 2021-01-15 ENCOUNTER — Inpatient Hospital Stay (HOSPITAL_COMMUNITY)
Admission: RE | Admit: 2021-01-15 | Discharge: 2021-01-18 | DRG: 788 | Disposition: A | Discharge status: Home / Self Care | Payer: Managed Care, Other (non HMO) | Attending: Obstetrics and Gynecology | Admitting: Obstetrics and Gynecology

## 2021-01-15 ENCOUNTER — Other Ambulatory Visit: Payer: Self-pay

## 2021-01-15 ENCOUNTER — Encounter (HOSPITAL_COMMUNITY)
Admission: RE | Disposition: A | Discharge status: Home / Self Care | Payer: Self-pay | Source: Home / Self Care | Attending: Obstetrics and Gynecology

## 2021-01-15 DIAGNOSIS — O30043 Twin pregnancy, dichorionic/diamniotic, third trimester: Secondary | ICD-10-CM | POA: Diagnosis not present

## 2021-01-15 DIAGNOSIS — D649 Anemia, unspecified: Secondary | ICD-10-CM | POA: Diagnosis present

## 2021-01-15 DIAGNOSIS — Z3A37 37 weeks gestation of pregnancy: Secondary | ICD-10-CM | POA: Diagnosis not present

## 2021-01-15 DIAGNOSIS — O365931 Maternal care for other known or suspected poor fetal growth, third trimester, fetus 1: Secondary | ICD-10-CM | POA: Diagnosis not present

## 2021-01-15 DIAGNOSIS — O9902 Anemia complicating childbirth: Secondary | ICD-10-CM | POA: Diagnosis not present

## 2021-01-15 DIAGNOSIS — O328XX2 Maternal care for other malpresentation of fetus, fetus 2: Secondary | ICD-10-CM | POA: Diagnosis present

## 2021-01-15 DIAGNOSIS — O365932 Maternal care for other known or suspected poor fetal growth, third trimester, fetus 2: Secondary | ICD-10-CM | POA: Diagnosis present

## 2021-01-15 DIAGNOSIS — O99214 Obesity complicating childbirth: Secondary | ICD-10-CM | POA: Diagnosis not present

## 2021-01-15 DIAGNOSIS — Z20822 Contact with and (suspected) exposure to covid-19: Secondary | ICD-10-CM | POA: Diagnosis present

## 2021-01-15 DIAGNOSIS — O99824 Streptococcus B carrier state complicating childbirth: Secondary | ICD-10-CM | POA: Diagnosis present

## 2021-01-15 DIAGNOSIS — Z98891 History of uterine scar from previous surgery: Secondary | ICD-10-CM | POA: Diagnosis not present

## 2021-01-15 DIAGNOSIS — O321XX1 Maternal care for breech presentation, fetus 1: Principal | ICD-10-CM | POA: Diagnosis present

## 2021-01-15 SURGERY — Surgical Case
Anesthesia: Spinal

## 2021-01-15 MED ORDER — DIPHENHYDRAMINE HCL 25 MG PO CAPS: 25.0000 mg | ORAL_CAPSULE | ORAL | Status: DC | PRN

## 2021-01-15 MED ORDER — CHLORHEXIDINE GLUCONATE 0.12 % MT SOLN
15.0000 mL | Freq: Once | OROMUCOSAL | Status: AC
  Administered 2021-01-15: 15 mL via OROMUCOSAL

## 2021-01-15 MED ORDER — ONDANSETRON HCL 4 MG/2ML IJ SOLN
INTRAMUSCULAR | Status: AC
  Filled 2021-01-15: qty 2

## 2021-01-15 MED ORDER — ORAL CARE MOUTH RINSE: 15.0000 mL | Freq: Once | OROMUCOSAL | Status: AC

## 2021-01-15 MED ORDER — DIPHENHYDRAMINE HCL 50 MG/ML IJ SOLN: 12.5000 mg | INTRAMUSCULAR | Status: DC | PRN

## 2021-01-15 MED ORDER — FENTANYL CITRATE (PF) 100 MCG/2ML IJ SOLN: 25.0000 ug | INTRAMUSCULAR | Status: DC | PRN

## 2021-01-15 MED ORDER — POVIDONE-IODINE 10 % EX SWAB
2.0000 "application " | Freq: Once | CUTANEOUS | Status: AC
  Administered 2021-01-15: 2 via TOPICAL

## 2021-01-15 MED ORDER — BUPIVACAINE IN DEXTROSE 0.75-8.25 % IT SOLN
INTRATHECAL | Status: DC | PRN
  Administered 2021-01-15: 1.5 mL via INTRATHECAL

## 2021-01-15 MED ORDER — DIPHENHYDRAMINE HCL 25 MG PO CAPS: 25.0000 mg | ORAL_CAPSULE | Freq: Four times a day (QID) | ORAL | Status: DC | PRN

## 2021-01-15 MED ORDER — IBUPROFEN 800 MG PO TABS
800.0000 mg | ORAL_TABLET | Freq: Three times a day (TID) | ORAL | Status: DC
  Administered 2021-01-16 – 2021-01-18 (×6): 800 mg via ORAL
  Filled 2021-01-15 (×6): qty 1

## 2021-01-15 MED ORDER — CHLORHEXIDINE GLUCONATE 0.12 % MT SOLN
OROMUCOSAL | Status: AC
  Filled 2021-01-15: qty 15

## 2021-01-15 MED ORDER — DEXAMETHASONE SODIUM PHOSPHATE 4 MG/ML IJ SOLN
INTRAMUSCULAR | Status: DC | PRN
  Administered 2021-01-15: 10 mg via INTRAVENOUS

## 2021-01-15 MED ORDER — MENTHOL 3 MG MT LOZG: 1.0000 | LOZENGE | OROMUCOSAL | Status: DC | PRN

## 2021-01-15 MED ORDER — ZOLPIDEM TARTRATE 5 MG PO TABS
5.0000 mg | ORAL_TABLET | Freq: Every evening | ORAL | Status: DC | PRN
Start: 2021-01-15 — End: 2021-01-18

## 2021-01-15 MED ORDER — LACTATED RINGERS IV SOLN: INTRAVENOUS | Status: DC

## 2021-01-15 MED ORDER — PHENYLEPHRINE HCL-NACL 20-0.9 MG/250ML-% IV SOLN
INTRAVENOUS | Status: DC | PRN
  Administered 2021-01-15: 60 ug/min via INTRAVENOUS

## 2021-01-15 MED ORDER — METHYLERGONOVINE MALEATE 0.2 MG PO TABS: 0.2000 mg | ORAL_TABLET | ORAL | Status: DC | PRN

## 2021-01-15 MED ORDER — DIBUCAINE (PERIANAL) 1 % EX OINT: 1.0000 "application " | TOPICAL_OINTMENT | CUTANEOUS | Status: DC | PRN

## 2021-01-15 MED ORDER — NALOXONE HCL 0.4 MG/ML IJ SOLN: 0.4000 mg | INTRAMUSCULAR | Status: DC | PRN

## 2021-01-15 MED ORDER — NALBUPHINE HCL 10 MG/ML IJ SOLN: 5.0000 mg | INTRAMUSCULAR | Status: DC | PRN

## 2021-01-15 MED ORDER — KETOROLAC TROMETHAMINE 30 MG/ML IJ SOLN
INTRAMUSCULAR | Status: AC
  Filled 2021-01-15: qty 1

## 2021-01-15 MED ORDER — NALBUPHINE HCL 10 MG/ML IJ SOLN
5.0000 mg | INTRAMUSCULAR | Status: DC | PRN
Start: 2021-01-15 — End: 2021-01-18

## 2021-01-15 MED ORDER — KETOROLAC TROMETHAMINE 30 MG/ML IJ SOLN
30.0000 mg | Freq: Four times a day (QID) | INTRAMUSCULAR | Status: AC | PRN
  Administered 2021-01-15: 30 mg via INTRAVENOUS

## 2021-01-15 MED ORDER — SIMETHICONE 80 MG PO CHEW: 80.0000 mg | CHEWABLE_TABLET | ORAL | Status: DC | PRN

## 2021-01-15 MED ORDER — MEPERIDINE HCL 25 MG/ML IJ SOLN: 6.2500 mg | INTRAMUSCULAR | Status: DC | PRN

## 2021-01-15 MED ORDER — ONDANSETRON HCL 4 MG/2ML IJ SOLN
INTRAMUSCULAR | Status: DC | PRN
  Administered 2021-01-15: 4 mg via INTRAVENOUS

## 2021-01-15 MED ORDER — LACTATED RINGERS IV SOLN: INTRAVENOUS | Status: DC | PRN

## 2021-01-15 MED ORDER — CEFAZOLIN SODIUM-DEXTROSE 2-4 GM/100ML-% IV SOLN
INTRAVENOUS | Status: AC
  Filled 2021-01-15: qty 100

## 2021-01-15 MED ORDER — OXYCODONE HCL 5 MG PO TABS
5.0000 mg | ORAL_TABLET | ORAL | Status: DC | PRN
  Administered 2021-01-16: 10 mg via ORAL
  Administered 2021-01-16 (×2): 5 mg via ORAL
  Administered 2021-01-17: 10 mg via ORAL
  Administered 2021-01-17: 5 mg via ORAL
  Administered 2021-01-17 – 2021-01-18 (×3): 10 mg via ORAL
  Filled 2021-01-15 (×2): qty 2
  Filled 2021-01-15: qty 1
  Filled 2021-01-15: qty 2
  Filled 2021-01-15: qty 1
  Filled 2021-01-15 (×2): qty 2
  Filled 2021-01-15: qty 1

## 2021-01-15 MED ORDER — ACETAMINOPHEN 500 MG PO TABS
1000.0000 mg | ORAL_TABLET | Freq: Four times a day (QID) | ORAL | Status: DC
  Administered 2021-01-15 – 2021-01-18 (×13): 1000 mg via ORAL
  Filled 2021-01-15 (×13): qty 2

## 2021-01-15 MED ORDER — KETOROLAC TROMETHAMINE 30 MG/ML IJ SOLN
30.0000 mg | Freq: Four times a day (QID) | INTRAMUSCULAR | Status: AC
  Administered 2021-01-15 – 2021-01-16 (×4): 30 mg via INTRAVENOUS
  Filled 2021-01-15 (×4): qty 1

## 2021-01-15 MED ORDER — ACETAMINOPHEN 500 MG PO TABS: 1000.0000 mg | ORAL_TABLET | Freq: Four times a day (QID) | ORAL | Status: DC

## 2021-01-15 MED ORDER — SOD CITRATE-CITRIC ACID 500-334 MG/5ML PO SOLN
30.0000 mL | Freq: Once | ORAL | Status: AC
  Administered 2021-01-15: 30 mL via ORAL

## 2021-01-15 MED ORDER — FENTANYL CITRATE (PF) 100 MCG/2ML IJ SOLN
INTRAMUSCULAR | Status: AC
  Filled 2021-01-15: qty 2

## 2021-01-15 MED ORDER — SCOPOLAMINE 1 MG/3DAYS TD PT72
1.0000 | MEDICATED_PATCH | Freq: Once | TRANSDERMAL | Status: DC
  Administered 2021-01-15: 1.5 mg via TRANSDERMAL

## 2021-01-15 MED ORDER — PRENATAL MULTIVITAMIN CH
1.0000 | ORAL_TABLET | Freq: Every day | ORAL | Status: DC
  Administered 2021-01-15 – 2021-01-18 (×4): 1 via ORAL
  Filled 2021-01-15 (×4): qty 1

## 2021-01-15 MED ORDER — PHENYLEPHRINE HCL-NACL 20-0.9 MG/250ML-% IV SOLN
INTRAVENOUS | Status: AC
  Filled 2021-01-15: qty 250

## 2021-01-15 MED ORDER — OXYTOCIN-SODIUM CHLORIDE 30-0.9 UT/500ML-% IV SOLN: 2.5000 [IU]/h | INTRAVENOUS | Status: AC

## 2021-01-15 MED ORDER — FAMOTIDINE 20 MG PO TABS
ORAL_TABLET | ORAL | Status: AC
  Filled 2021-01-15: qty 1

## 2021-01-15 MED ORDER — SENNOSIDES-DOCUSATE SODIUM 8.6-50 MG PO TABS
2.0000 | ORAL_TABLET | ORAL | Status: DC
  Administered 2021-01-16 – 2021-01-18 (×3): 2 via ORAL
  Filled 2021-01-15 (×3): qty 2

## 2021-01-15 MED ORDER — FENTANYL CITRATE (PF) 100 MCG/2ML IJ SOLN
INTRAMUSCULAR | Status: DC | PRN
  Administered 2021-01-15: 15 ug via INTRATHECAL

## 2021-01-15 MED ORDER — WITCH HAZEL-GLYCERIN EX PADS
1.0000 | MEDICATED_PAD | CUTANEOUS | Status: DC | PRN
Start: 2021-01-15 — End: 2021-01-18
  Administered 2021-01-16: 1 via TOPICAL

## 2021-01-15 MED ORDER — MAGNESIUM HYDROXIDE 400 MG/5ML PO SUSP: 30.0000 mL | ORAL | Status: DC | PRN

## 2021-01-15 MED ORDER — OXYTOCIN-SODIUM CHLORIDE 30-0.9 UT/500ML-% IV SOLN
INTRAVENOUS | Status: DC | PRN
  Administered 2021-01-15: 30 mL via INTRAVENOUS

## 2021-01-15 MED ORDER — SCOPOLAMINE 1 MG/3DAYS TD PT72
MEDICATED_PATCH | TRANSDERMAL | Status: AC
  Filled 2021-01-15: qty 1

## 2021-01-15 MED ORDER — MEASLES, MUMPS & RUBELLA VAC IJ SOLR: 0.5000 mL | Freq: Once | INTRAMUSCULAR | Status: DC

## 2021-01-15 MED ORDER — CEFAZOLIN SODIUM-DEXTROSE 2-4 GM/100ML-% IV SOLN: 2.0000 g | INTRAVENOUS | Status: DC

## 2021-01-15 MED ORDER — SCOPOLAMINE 1 MG/3DAYS TD PT72: 1.0000 | MEDICATED_PATCH | Freq: Once | TRANSDERMAL | Status: DC

## 2021-01-15 MED ORDER — MORPHINE SULFATE (PF) 0.5 MG/ML IJ SOLN
INTRAMUSCULAR | Status: DC | PRN
  Administered 2021-01-15: 150 ug via INTRATHECAL

## 2021-01-15 MED ORDER — CEFAZOLIN SODIUM-DEXTROSE 2-3 GM-%(50ML) IV SOLR
INTRAVENOUS | Status: DC | PRN
  Administered 2021-01-15: 2 g via INTRAVENOUS

## 2021-01-15 MED ORDER — COCONUT OIL OIL: 1.0000 "application " | TOPICAL_OIL | Status: DC | PRN

## 2021-01-15 MED ORDER — SOD CITRATE-CITRIC ACID 500-334 MG/5ML PO SOLN
ORAL | Status: AC
  Filled 2021-01-15: qty 30

## 2021-01-15 MED ORDER — NALBUPHINE HCL 10 MG/ML IJ SOLN
5.0000 mg | Freq: Once | INTRAMUSCULAR | Status: DC | PRN
Start: 2021-01-15 — End: 2021-01-18

## 2021-01-15 MED ORDER — PROMETHAZINE HCL 25 MG/ML IJ SOLN
6.2500 mg | INTRAMUSCULAR | Status: DC | PRN
Start: 2021-01-15 — End: 2021-01-15

## 2021-01-15 MED ORDER — NALOXONE HCL 4 MG/10ML IJ SOLN
1.0000 ug/kg/h | INTRAVENOUS | Status: DC | PRN
  Filled 2021-01-15: qty 5

## 2021-01-15 MED ORDER — MORPHINE SULFATE (PF) 0.5 MG/ML IJ SOLN
INTRAMUSCULAR | Status: AC
  Filled 2021-01-15: qty 10

## 2021-01-15 MED ORDER — SODIUM CHLORIDE 0.9% FLUSH: 3.0000 mL | INTRAVENOUS | Status: DC | PRN

## 2021-01-15 MED ORDER — KETOROLAC TROMETHAMINE 30 MG/ML IJ SOLN: 30.0000 mg | Freq: Four times a day (QID) | INTRAMUSCULAR | Status: AC | PRN

## 2021-01-15 MED ORDER — ONDANSETRON HCL 4 MG/2ML IJ SOLN: 4.0000 mg | Freq: Three times a day (TID) | INTRAMUSCULAR | Status: DC | PRN

## 2021-01-15 MED ORDER — NALBUPHINE HCL 10 MG/ML IJ SOLN: 5.0000 mg | Freq: Once | INTRAMUSCULAR | Status: DC | PRN

## 2021-01-15 MED ORDER — METHYLERGONOVINE MALEATE 0.2 MG/ML IJ SOLN: 0.2000 mg | INTRAMUSCULAR | Status: DC | PRN

## 2021-01-15 MED ORDER — TETANUS-DIPHTH-ACELL PERTUSSIS 5-2.5-18.5 LF-MCG/0.5 IM SUSY: 0.5000 mL | PREFILLED_SYRINGE | Freq: Once | INTRAMUSCULAR | Status: DC

## 2021-01-15 MED ORDER — FAMOTIDINE 20 MG PO TABS
20.0000 mg | ORAL_TABLET | Freq: Once | ORAL | Status: AC
  Administered 2021-01-15: 20 mg via ORAL

## 2021-01-15 SURGICAL SUPPLY — 33 items
APL SKNCLS STERI-STRIP NONHPOA (GAUZE/BANDAGES/DRESSINGS) ×1
BENZOIN TINCTURE PRP APPL 2/3 (GAUZE/BANDAGES/DRESSINGS) ×3 IMPLANT
CHLORAPREP W/TINT 26ML (MISCELLANEOUS) ×3 IMPLANT
CLAMP CORD UMBIL (MISCELLANEOUS) IMPLANT
CLOTH BEACON ORANGE TIMEOUT ST (SAFETY) ×3 IMPLANT
DRSG OPSITE POSTOP 4X10 (GAUZE/BANDAGES/DRESSINGS) ×3 IMPLANT
ELECT REM PT RETURN 9FT ADLT (ELECTROSURGICAL) ×3
ELECTRODE REM PT RTRN 9FT ADLT (ELECTROSURGICAL) ×1 IMPLANT
EXTRACTOR VACUUM KIWI (MISCELLANEOUS) IMPLANT
EXTRACTOR VACUUM M CUP 4 TUBE (SUCTIONS) IMPLANT
EXTRACTOR VACUUM M CUP 4' TUBE (SUCTIONS)
GLOVE BIOGEL PI IND STRL 7.0 (GLOVE) ×1 IMPLANT
GLOVE BIOGEL PI INDICATOR 7.0 (GLOVE) ×2
GLOVE ORTHO TXT STRL SZ7.5 (GLOVE) ×3 IMPLANT
GOWN STRL REUS W/TWL LRG LVL3 (GOWN DISPOSABLE) ×6 IMPLANT
KIT ABG SYR 3ML LUER SLIP (SYRINGE) IMPLANT
NEEDLE HYPO 25X5/8 SAFETYGLIDE (NEEDLE) ×3 IMPLANT
NS IRRIG 1000ML POUR BTL (IV SOLUTION) ×3 IMPLANT
PACK C SECTION WH (CUSTOM PROCEDURE TRAY) ×3 IMPLANT
PAD OB MATERNITY 4.3X12.25 (PERSONAL CARE ITEMS) ×3 IMPLANT
PENCIL SMOKE EVAC W/HOLSTER (ELECTROSURGICAL) ×3 IMPLANT
RTRCTR C-SECT PINK 25CM LRG (MISCELLANEOUS) ×3 IMPLANT
STRIP SURGICAL 1/4 X 6 IN (GAUZE/BANDAGES/DRESSINGS) ×3 IMPLANT
SUT CHROMIC 1 CTX 36 (SUTURE) ×6 IMPLANT
SUT PLAIN 0 NONE (SUTURE) IMPLANT
SUT PLAIN 2 0 XLH (SUTURE) IMPLANT
SUT VIC AB 0 CT1 27 (SUTURE) ×6
SUT VIC AB 0 CT1 27XBRD ANBCTR (SUTURE) ×2 IMPLANT
SUT VIC AB 2-0 CT1 (SUTURE) ×3 IMPLANT
SUT VIC AB 4-0 KS 27 (SUTURE) IMPLANT
TOWEL OR 17X24 6PK STRL BLUE (TOWEL DISPOSABLE) ×3 IMPLANT
TRAY FOLEY W/BAG SLVR 14FR LF (SET/KITS/TRAYS/PACK) ×3 IMPLANT
WATER STERILE IRR 1000ML POUR (IV SOLUTION) ×3 IMPLANT

## 2021-01-15 NOTE — Anesthesia Procedure Notes (Signed)
Spinal  Patient location during procedure: OR Start time: 01/15/2021 7:30 AM End time: 01/15/2021 7:35 AM Reason for block: surgical anesthesia Staffing Performed: anesthesiologist  Anesthesiologist: Cecile Hearing, MD Preanesthetic Checklist Completed: patient identified, IV checked, risks and benefits discussed, surgical consent, monitors and equipment checked, pre-op evaluation and timeout performed Spinal Block Patient position: sitting Prep: DuraPrep and site prepped and draped Patient monitoring: continuous pulse ox and blood pressure Approach: midline Location: L3-4 Injection technique: single-shot Needle Needle type: Pencan  Needle gauge: 24 G Assessment Sensory level: T4 Events: CSF return Additional Notes Functioning IV was confirmed and monitors were applied. Sterile prep and drape, including hand hygiene, mask and sterile gloves were used. The patient was positioned and the spine was prepped. The skin was anesthetized with lidocaine.  Free flow of clear CSF was obtained prior to injecting local anesthetic into the CSF.  The spinal needle aspirated freely following injection.  The needle was carefully withdrawn.  The patient tolerated the procedure well. Consent was obtained prior to procedure with all questions answered and concerns addressed. Risks including but not limited to bleeding, infection, nerve damage, paralysis, failed block, inadequate analgesia, allergic reaction, high spinal, itching and headache were discussed and the patient wished to proceed.   Arrie Aran, MD

## 2021-01-15 NOTE — Anesthesia Postprocedure Evaluation (Signed)
Anesthesia Post Note  Patient: Ruth Lin  Procedure(s) Performed: CESAREAN SECTION MULTI-GESTATIONAL (N/A )     Patient location during evaluation: PACU Anesthesia Type: Spinal Level of consciousness: oriented, awake and alert and awake Pain management: pain level controlled Vital Signs Assessment: post-procedure vital signs reviewed and stable Respiratory status: spontaneous breathing, respiratory function stable and nonlabored ventilation Cardiovascular status: blood pressure returned to baseline and stable Postop Assessment: no headache, no backache, no apparent nausea or vomiting, spinal receding and patient able to bend at knees Anesthetic complications: no   No complications documented.  Last Vitals:  Vitals:   01/15/21 0945 01/15/21 1000  BP: 113/66   Pulse: (!) 101 85  Resp: (!) 23 19  Temp:  36.4 C  SpO2: 100%     Last Pain:  Vitals:   01/15/21 1000  TempSrc:   PainSc: 0-No pain   Pain Goal:    LLE Motor Response: Purposeful movement (01/15/21 1000)   RLE Motor Response: Purposeful movement (01/15/21 1000)       Epidural/Spinal Function Cutaneous sensation: Able to Discern Pressure (01/15/21 1000), Patient able to flex knees: Yes (01/15/21 1000), Patient able to lift hips off bed: No (01/15/21 1000), Back pain beyond tenderness at insertion site: No (01/15/21 1000), Progressively worsening motor and/or sensory loss: No (01/15/21 1000), Bowel and/or bladder incontinence post epidural: No (01/15/21 1000)  Cecile Hearing

## 2021-01-15 NOTE — Lactation Note (Signed)
This note was copied from a baby's chart. Lactation Consultation Note  Patient Name: Ruth Lin [id number] Date: 01/15/2021 Reason for consult: Follow-up assessment;Mother's request;Difficult latch;Early term 37-38.6wks;Infant < 6lbs Age:26 hours   On arrival, Mom stated infant B not lathing. Mom had infant swaddles at the breast. LC gave infant 1 ml of EBM via spoon. Mom pre pumped for about 5 minutes. LC then able latch infant with signs of milk transfer, STS.   LC did suck training, infant able to extend the tongue pass the gumline.   Plan 1. To feed based on cues 8-12x in 24 hrs no more than 3 hrs without an attempt. Mom to offer EBM via spoon or finger if not able to get infant to latch.          2. LC reviewed LPTI guidelines for supplementation based on hrs of age after birth. Dad to paced bottle feed using slow flow nipple EBM based on guidelines.         3. Mom to pump using dEBP q 3hrs for 15 minutes.   All questions answered at the end of the visit.   Maternal Data Has patient been taught Hand Expression?: Yes  Feeding Mother's Current Feeding Choice: Breast Milk  LATCH Score Latch: Repeated attempts needed to sustain latch, nipple held in mouth throughout feeding, stimulation needed to elicit sucking reflex.  Audible Swallowing: Spontaneous and intermittent  Type of Nipple: Everted at rest and after stimulation  Comfort (Breast/Nipple): Soft / non-tender  Hold (Positioning): Assistance needed to correctly position infant at breast and maintain latch.  LATCH Score: 8   Lactation Tools Discussed/Used Tools: Pump;Flanges Flange Size: 24 Breast pump type: Double-Electric Breast Pump Pump Education: Setup, frequency, and cleaning;Milk Storage Reason for Pumping: increase stimulation Pumping frequency: every 3 hrs for 15 minutes  Interventions Interventions: Breast feeding basics reviewed;Support pillows;Education;Assisted with latch;Position options;Skin to  skin;Expressed milk;Breast massage;Hand express;Breast compression;DEBP;Adjust position  Discharge    Consult Status Consult Status: Follow-up Date: 01/16/21 Follow-up type: In-patient    Ruth Lin  Ruth Lin 01/15/2021, 5:28 PM

## 2021-01-15 NOTE — Op Note (Signed)
Preoperative diagnosis: Di/di twin Intrauterine pregnancy at 37 weeks, IUGR x 2, breech presentation Postoperative diagnosis: Same Procedure: Primary low transverse cesarean section without extensions Surgeon: Lavina Hamman M.D. Anesthesia: Spinal Findings: Patient had normal gravid anatomy and delivered viable female infants:  Baby A-frank breech, clear fluid, crying, Apgars and weight pending, Baby B-footling breech, clear fluid, crying, Apgars and weight Estimated blood loss: 370 cc Specimens: Placentas for routine pathology Complications: None  Procedure in detail: Patient was taken to the operating room and placed in the sitting position. Dr. Desmond Lope instilled spinal anesthesia. She was then placed in the dorsosupine position with left lateral tilt. Abdomen was prepped and draped in the usual sterile fashion and a Foley catheter was inserted. The level of her anesthesia was found to be adequate. Abdomen was entered via a standard Pfannenstiel incision without difficulty. The Alexis disposable self-retaining retractor was placed and good visualization was achieved. A 4 cm transverse incision was then made in the lower uterine segment pushing the bladder inferior. Once the uterine cavity was entered the incision was extended bilaterally digitally. Membranes ruptured revealing clear fluid. The fetal breech was at the incision and was delivered atraumatically. Legs, abdomen and chest delivered easily. Arms were swept across the chest and head delivered easily. Mouth and nares were suctioned and the cord was doubly clamped and cut after one minute and the infant handed to the awaiting pediatric team. Second amniotic sac was then ruptured revealing clear fluid and feet.  Feet were grasped and with some fundal pressure baby delivered to the chest.  Arms swept across chest and head easily delivered.  Body cord reduced.  Mouth and nose suction, cord doubly clamped and cut after one minute and baby handed to  awaiting pediatric team.  Cord blood was obtained from each cord, cord clamp on B cord. The placentas were manually removed. Uterus was wiped dry with clean lap pad and all clots and debris were removed. The uterine incision was inspected and found to be free of extensions. Uterine incision was then closed in 2 layers with the first layer being a running locking layer with #1 chromic and the second layer being an imbricating layer also with #1 chromic. This achieved adequate closure and hemostasis. Tubes and ovaries were inspected and found to be normal. Uterine incision was again inspected and found to be hemostatic. The Alexis retractor was removed. Subfascial space was irrigated and made hemostatic with electrocautery. Fascia was closed in running fashion starting at both ends and meeting in the middle with 0 Vicryl. Subcutaneous tissue was then irrigated and made hemostatic with electrocautery. Skin was closed with running subcuticular suture of 4-0 Vicryl followed by Steri-Strips with benzoin. Patient tolerated procedure well and was taken to the recovery in stable condition. Babies stayed with mom throughout the procedure. Counts were correct x2, she received Ancef 2 g IV the beginning of the procedure and she had PAS hose on throughout the procedure.

## 2021-01-15 NOTE — Interval H&P Note (Signed)
History and Physical Interval Note:  01/15/2021 7:11 AM  Ruth Lin  has presented today for surgery, with the diagnosis of twins breech.  The various methods of treatment have been discussed with the patient and family. After consideration of risks, benefits and other options for treatment, the patient has consented to  Procedure(s): CESAREAN SECTION MULTI-GESTATIONAL (N/A), IUGR, breech presentation as a surgical intervention.  The patient's history has been reviewed, patient examined, no change in status, stable for surgery.  I have reviewed the patient's chart and labs.  Questions were answered to the patient's satisfaction.     Leighton Roach [name]

## 2021-01-15 NOTE — Transfer of Care (Signed)
Immediate Anesthesia Transfer of Care Note  Patient: Ruth Lin  Procedure(s) Performed: CESAREAN SECTION MULTI-GESTATIONAL (N/A )  Patient Location: PACU  Anesthesia Type:Spinal  Level of Consciousness: awake  Airway & Oxygen Therapy: Patient Spontanous Breathing  Post-op Assessment: Report given to RN and Post -op Vital signs reviewed and stable  Post vital signs: Reviewed and stable  Last Vitals:  Vitals Value Taken Time  BP    Temp    Pulse    Resp    SpO2      Last Pain:  Vitals:   01/15/21 0537  TempSrc: Oral  PainSc: 0-No pain         Complications: No complications documented.

## 2021-01-15 NOTE — Lactation Note (Signed)
This note was copied from a baby's chart. Lactation Consultation Note  Patient Name: Ruth Lin FBPZW'C Date: 01/15/2021 Reason for consult: Initial assessment;Infant < 6lbs;Multiple gestation Age3 hours old    Twins P4, 37.[redacted] weeks gestation. Mother reports that she breastfed 3 other children. Mother breastfed 3 yrs, 15 months and 1 yr. Mother is very experienced.  Infants are both at breast.  Baby B has a low blood sugar of 28. Staff nurse ask for my assistance to work with infant  To get infant to breastfeed. Infant breastfeeding , father reports that infant has been feeding for 10 mins. Infant came off the breast . Attempt to get infant back and she refused.  Assist mother with hand expressing and infant was fed 2.5 ml with a spoon.  Infant return back to the breast an began with good strong suckling and swallows.  Baby A breastfeeding well. She sustained latch for 15 mins mother tandum feeding infants. Father is very supportive and is by her side to assist with latching infants.  Parents were given [facility] brochure with basic teaching . Parents were also given LPI parent instructions sheet. Discussed supplemental guidelines.   Informed parents that [facility] is available if infants need extra calories .  A WIC referral was sent to [facility] for mother to get a pump.   A Pump is to be sat up in  Mothers room by staff nurse Liana Crocker.  Maternal Data Has patient been taught Hand Expression?: Yes Does the patient have breastfeeding experience prior to this delivery?: Yes How long did the patient breastfeed?: 3 yrs, 15 month, 1 yr  Feeding Mother's Current Feeding Choice: Breast Milk  LATCH Score Latch: Grasps breast easily, tongue down, lips flanged, rhythmical sucking.  Audible Swallowing: Spontaneous and intermittent  Type of Nipple: Everted at rest and after stimulation  Comfort (Breast/Nipple): Soft / non-tender  Hold (Positioning): Assistance needed to correctly position  infant at breast and maintain latch.  LATCH Score: 9   Lactation Tools Discussed/Used    Interventions Interventions: Adjust position;Support pillows;Position options;Breast compression;Hand express;Skin to skin;Assisted with latch;Breast feeding basics reviewed;DEBP  Discharge Pump: Manual Curlene Labrum) WIC Program: Yes  Consult Status Consult Status: Follow-up Date: 01/16/21 Follow-up type: In-patient    Stevan Born [facility] 01/15/2021, 3:31 PM

## 2021-01-16 LAB — CBC
HCT: 19.3 % — ABNORMAL LOW (ref 36.0–46.0)
Hemoglobin: 6.1 g/dL — CL (ref 12.0–15.0)
MCH: 25.3 pg — ABNORMAL LOW (ref 26.0–34.0)
MCHC: 31.6 g/dL (ref 30.0–36.0)
MCV: 80.1 fL (ref 80.0–100.0)
Platelets: 168 10*3/uL (ref 150–400)
RBC: 2.41 MIL/uL — ABNORMAL LOW (ref 3.87–5.11)
RDW: 14.2 % (ref 11.5–15.5)
WBC: 10 10*3/uL (ref 4.0–10.5)
nRBC: 0.2 % (ref 0.0–0.2)

## 2021-01-16 LAB — PREPARE RBC (CROSSMATCH)

## 2021-01-16 LAB — NO BLOOD PRODUCTS

## 2021-01-16 MED ORDER — POLYSACCHARIDE IRON COMPLEX 150 MG PO CAPS
150.0000 mg | ORAL_CAPSULE | Freq: Every day | ORAL | Status: DC
  Administered 2021-01-16 – 2021-01-18 (×3): 150 mg via ORAL
  Filled 2021-01-16 (×3): qty 1

## 2021-01-16 MED ORDER — SODIUM CHLORIDE 0.9% IV SOLUTION: Freq: Once | INTRAVENOUS | Status: AC

## 2021-01-16 NOTE — Lactation Note (Signed)
This note was copied from a baby's chart. Lactation Consultation Note  Patient Name: Ruth Lin Date: 01/16/2021 Reason for consult: Follow-up assessment;Infant < 6lbs;Early term 37-38.6wks Age:26 hours  Follow up consult to 32 hours old ET twins. Mother reports she tried pumping but she is currently unable to collect anything. Mother states "pumping is not working for me" Baby B is in the basinet upon arrival. Per mother she has improved feeding at breast and latched for ~77minutes.  Baby A  is being formula fed by father upon arrival. Mother states baby is doing well. Mother explains she is making sure they both eat at least every 2 hours because they are very small. Mother reports both infants have adequate output per age.  Encouraged parents to contact lactation services for more assistance and praised for their efforts and dedication.    Maternal Data Has patient been taught Hand Expression?: Yes Does the patient have breastfeeding experience prior to this delivery?: Yes  Feeding Mother's Current Feeding Choice: Breast Milk and Formula Nipple Type: Extra Slow Flow  Interventions Interventions: Breast feeding basics reviewed;DEBP;Expressed milk;Education  Consult Status Consult Status: Follow-up Date: 01/17/21 Follow-up type: In-patient    Ruth Lin A Higuera Ancidey 01/16/2021, 5:38 PM

## 2021-01-16 NOTE — Progress Notes (Addendum)
Pt called RN into the room and requested the RN to stopped the blood transfusion because the IV was burning. RN stopped, disconnected and flushed the IV. At this time, the pt wanted the RN to leave her disconnected and refused the rest of the transfusion. In total, the pt received approximately of the transfusion. RN documented the stop transfusion information and assessed pt VS. RN will notify on call MD.   Herbert Moors, RN

## 2021-01-16 NOTE — Progress Notes (Signed)
Blood refusal consent signed and witnessed. RN notified Dr. Reina Fuse of pt's decision.     Herbert Moors, RN

## 2021-01-16 NOTE — Progress Notes (Addendum)
POSTPARTUM POSTOP PROGRESS NOTE  POD #1  Subjective:  No acute events overnight.  Pt denies problems with ambulating, voiding or po intake.  She denies nausea or vomiting.  Pain is well controlled.  She has not had flatus. She has not had bowel movement.  Lochia Minimal. Bo\th baby girls in room! Both breastfeeding, getting some supplementation with Neosure given SGA.   Objective: Blood pressure (!) 112/47, pulse 68, temperature 98.4 F (36.9 C), temperature source Axillary, resp. rate 16, height 5' (1.524 m), weight 79.8 kg, SpO2 99 %, unknown if currently breastfeeding.  Physical Exam:  General: alert, cooperative and no distress Lochia:normal flow Chest: CTAB Heart: RRR no m/r/g Abdomen: +BS, soft, nontender. Mild tympany epigastrically Uterine Fundus: firm, 2cm below umbilicus. Honeycomb dressing intact, minimal drainage Extremities: neg edema, neg calf TTP BL, neg Homans BL  No results for input(s): HGB, HCT in the last 72 hours.  Assessment/Plan:  ASSESSMENT: Ruth Lin is a 25 y.o. [id number] s/p PLTCS @ [redacted]w[redacted]d for fetal malpresentation and IUGR x2. PNC c/b di-di-TIUP.   Plan for discharge tomorrow, Breastfeeding and Lactation consult Starting Hgb 8.4, repeat pending this AM. Will follow anemia UPDATE - repeat CBC with Hgb 6.1. Patient is currently asymptomatic with normal vital signs. PO iron ordered, will recheck in afternoon after ambulation and assess symptoms  LOS: 1 day

## 2021-01-16 NOTE — Progress Notes (Signed)
Reported Hemoglobin 6.1, BP 112/47, HR 68,O2 sat 100% Temp 98.4 to Dr Reina Fuse. Pt is not dizzy when ambulating.

## 2021-01-16 NOTE — Progress Notes (Signed)
Presented to patient room to discuss critical Hgb level of 6.1 this AM. Patient has sat up in bed and ambulated to bathroom. Does endorse fatigue while ambulating; patient with very mild pallor. Working on pain control, educated between gas pain vs incisional pain and how different pain options address each (aka possible increase in constipation with increased opiod use). Tympany still present, has not ambulated in halls yet  Reviewed hgb in postop setting with twins and desire to breastfeed. Reviewed options of IV venofer vs pRBC. Pt amenable to RBC transfusion. 2u ordered, will recheck CBC in AM.   Firm fundus on exam with minimal VB on peripad  BP (!) 102/54 (BP Location: Left Arm)   Pulse (!) 57   Temp 98.6 F (37 C) (Oral)   Resp 18   Ht 5' (1.524 m)   Wt 79.8 kg   SpO2 100%   Breastfeeding Unknown   BMI 34.35 kg/m

## 2021-01-17 LAB — CBC WITH DIFFERENTIAL/PLATELET
Abs Immature Granulocytes: 0.06 10*3/uL (ref 0.00–0.07)
Basophils Absolute: 0 10*3/uL (ref 0.0–0.1)
Basophils Relative: 0 %
Eosinophils Absolute: 0.1 10*3/uL (ref 0.0–0.5)
Eosinophils Relative: 1 %
HCT: 21.2 % — ABNORMAL LOW (ref 36.0–46.0)
Hemoglobin: 7 g/dL — ABNORMAL LOW (ref 12.0–15.0)
Immature Granulocytes: 1 %
Lymphocytes Relative: 13 %
Lymphs Abs: 1.5 10*3/uL (ref 0.7–4.0)
MCH: 26.4 pg (ref 26.0–34.0)
MCHC: 33 g/dL (ref 30.0–36.0)
MCV: 80 fL (ref 80.0–100.0)
Monocytes Absolute: 1 10*3/uL (ref 0.1–1.0)
Monocytes Relative: 8 %
Neutro Abs: 9.3 10*3/uL — ABNORMAL HIGH (ref 1.7–7.7)
Neutrophils Relative %: 77 %
Platelets: 189 10*3/uL (ref 150–400)
RBC: 2.65 MIL/uL — ABNORMAL LOW (ref 3.87–5.11)
RDW: 14.7 % (ref 11.5–15.5)
WBC: 11.8 10*3/uL — ABNORMAL HIGH (ref 4.0–10.5)
nRBC: 0.3 % — ABNORMAL HIGH (ref 0.0–0.2)

## 2021-01-17 LAB — TYPE AND SCREEN
ABO/RH(D): A POS
Antibody Screen: NEGATIVE
Unit division: 0
Unit division: 0

## 2021-01-17 LAB — BPAM RBC
Blood Product Expiration Date: 202204062359
Blood Product Expiration Date: 202204062359
ISSUE DATE / TIME: 202203292044
ISSUE DATE / TIME: 202203301217
Unit Type and Rh: 6200
Unit Type and Rh: 6200

## 2021-01-17 MED ORDER — SODIUM CHLORIDE 0.9 % IV SOLN
500.0000 mg | Freq: Once | INTRAVENOUS | Status: AC
  Administered 2021-01-17: 500 mg via INTRAVENOUS
  Filled 2021-01-17: qty 25

## 2021-01-17 NOTE — Lactation Note (Signed)
This note was copied from a baby's chart. Lactation Consultation Note  Patient Name: Girl Gabbie Marzo MGNOI'B Date: 01/17/2021 Reason for consult: Follow-up assessment;Nipple pain/trauma;Early term 37-38.6wks;Infant < 6lbs;Infant weight loss;Other (Comment) (per mom and da recently fed breast / bottle afterwards) Age:26 hours  Per mom having nipple soreness. LC offered to assess , and LC noted positional strips on both nipples and skin to be dry. Easily hand expressed and encouraged mom to use her EBM to her nipples liberally. LC also provided comfort gels for after feedings , alternating with shells while awake to do reverse pressure between feedings due to areola edema . The left areola more compressible and per mom less sore.  Per mom had tried the DEBP and not EBM yield. LC reassured mom it takes time.  Mom asked for a hand pump, the #24 F is a good fit, only drops. When mom hand expressed or latches better results.  Baby A - @ the start of the consult baby was asleep. She woke up at 2 1/2 hours, and due to mom being in the bathroom , dad fed her 18 ml of formula, baby was still awake and mom latched her by herself and baby fed 5 mins with depth , multiple swallows and released. Latch 9  Moms milk is in bilaterally. Breast softened.  Baby B - @ the start of the consult baby wide awake. Baby had recently fed.  LC reviewed and updated both charts.    Maternal Data    Feeding Mother's Current Feeding Choice: Breast Milk and Formula Nipple Type: Slow - flow  LATCH Score                    Lactation Tools Discussed/Used Tools: Shells;Comfort gels;Pump;Flanges Flange Size: 24;27 Breast pump type: Manual;Double-Electric Breast Pump Pump Education: Setup, frequency, and cleaning;Milk Storage Reason for Pumping: Twins, and less than 6 pounds , ET , sore nipples and mom asked for a hand pump  Interventions Interventions: Breast feeding basics reviewed;Shells;Comfort gels;Hand  pump;DEBP;Education  Discharge    Consult Status Consult Status: Follow-up Date: 01/18/21 Follow-up type: In-patient    Matilde Sprang Aleighya Mcanelly 01/17/2021, 4:47 PM

## 2021-01-17 NOTE — Progress Notes (Signed)
Subjective: Postpartum Day 2: Cesarean Delivery  Patient reports incisional pain, tolerating PO, + flatus and no problems voiding.  She is able to ambulate fine, still pretty fatigued.  Trying to breastfeed and seeing some increase in supply  Objective: Vital signs in last 24 hours: Temp:  [98 F (36.7 C)-98.8 F (37.1 C)] 98.4 F (36.9 C) (03/30 0545) Pulse Rate:  [57-73] 59 (03/30 0545) Resp:  [17-19] 17 (03/30 0545) BP: (102-115)/(54-74) 115/62 (03/30 0545) SpO2:  [100 %] 100 % (03/30 0545)  Physical Exam:  General: alert and cooperative Lochia: appropriate Uterine Fundus: firm Incision: C/D/I   Recent Labs    01/16/21 0811 01/17/21 0745  HGB 6.1* 7.0*  HCT 19.3* 21.2*    Assessment/Plan: Status post Cesarean section. Postoperative course complicated by relative anemia given her chronic anemia prior to delivery.   Pt tolerating fairly well and given of PRBC's yesterday before requested d/c of blood infusion do to stinging at IV site.  Hgb at 7.0 this AM.  D/w pt options of doing nothing further vs IV venofer.  I do not think she needs anymore blood.  SHe is agreeable to trying the venofer if IV site working well.  Plan d/c tomorrow if doing well.  Oliver Pila 01/17/2021, 9:28 AM

## 2021-01-17 NOTE — Lactation Note (Signed)
This note was copied from a baby's chart. Lactation Consultation Note  Patient Name: Wrenley Sayed JASNK'N Date: 01/17/2021 Reason for consult: Follow-up assessment;Early term 37-38.6wks;Infant < 6lbs;Infant weight loss;Nipple pain/trauma Age:26 hours  See baby A note for LC note.  Maternal Data    Feeding Mother's Current Feeding Choice: Breast Milk and Formula Nipple Type: Slow - flow  LATCH Score                    Lactation Tools Discussed/Used Tools: Shells;Pump;Flanges Flange Size: 24;27 Breast pump type: Double-Electric Breast Pump;Manual Pump Education: Milk Storage;Setup, frequency, and cleaning  Interventions Interventions: Breast feeding basics reviewed;Shells;Comfort gels;Hand pump;DEBP;Education  Discharge    Consult Status Consult Status: Follow-up Date: 01/18/21 Follow-up type: In-patient    Matilde Sprang Marynell Bies 01/17/2021, 4:53 PM

## 2021-01-18 LAB — SURGICAL PATHOLOGY

## 2021-01-18 MED ORDER — IBUPROFEN 600 MG PO TABS: 600.0000 mg | ORAL_TABLET | Freq: Four times a day (QID) | ORAL | 1 refills | Status: DC | PRN

## 2021-01-18 MED ORDER — FERROUS SULFATE 325 (65 FE) MG PO TBEC
325.0000 mg | DELAYED_RELEASE_TABLET | ORAL | 1 refills | Status: DC
Start: 2021-01-18 — End: 2022-06-06

## 2021-01-18 MED ORDER — OXYCODONE-ACETAMINOPHEN 5-325 MG PO TABS: 1.0000 | ORAL_TABLET | ORAL | 0 refills | Status: AC | PRN

## 2021-01-18 NOTE — Discharge Summary (Signed)
Postpartum Discharge Summary  Date of Service updated:      Patient Name: Ruth Lin DOB: April 26, 1996 MRN: [id number]  Date of admission: 01/15/2021 Delivery date:   Estella, Malatesta [[id number]]  01/15/2021    Jaline, Pincock [[id number]]  01/15/2021   Delivering provider:    Marney, Treloar Girl Rowene [[id number]]  [facility], TODD    Adele, Milson [[id number]]  [facility], TODD   Date of discharge: 01/18/2021  Admitting diagnosis: Dichorionic diamniotic twin pregnancy in third trimester [O30.043] S/P cesarean section [Z98.891] Intrauterine pregnancy: [redacted]w[redacted]d     Secondary diagnosis:  Active Problems:   Dichorionic diamniotic twin pregnancy in third trimester   S/P cesarean section  Additional problems: chronic anemia     Discharge diagnosis: Term Pregnancy Delivered      - twins                                        Post partum procedures:blood transfusion and and IV venofer Augmentation: N/A Complications: None  Hospital course: Sceduled C/S   25 y.o. yo [phone] at [redacted]w[redacted]d was admitted to the hospital 01/15/2021 for scheduled cesarean section with the following indication:twins .Delivery details are as follows:  Membrane Rupture Time/Date:    Lurene, Robley Girl [place] [[id number]]  7:57 AM    Ophie, Burrowes Kang [[id number]]  8:00 AM  ,   Ayeshia, Coppin Girl Tricha [[id number]]  01/15/2021    Susa, Bones [[id number]]  01/15/2021    Delivery Method:   Desi, Carby [[id number]]  C-Section, Low Transverse    Jerilee, Space [[id number]]  C-Section, Low Transverse   Details of operation can be found in separate operative note.  Patient had an uncomplicated postpartum course.  She is ambulating, tolerating a regular diet, passing flatus, and urinating well. Patient is discharged home in stable condition on  01/18/21        Newborn Data: Birth date:   Seryna, Marek [[id number]]  01/15/2021    Japneet, Staggs [[id number]]  01/15/2021   Birth time:   Jaqlyn, [place] Girl Katherine [[id number]]  7:57 AM     Shandon, Matson [place] [[id number]]  8:00 AM   Gender:   Janus, Vlcek [[id number]]  Female    Giada, Schoppe [[id number]]  Female   Living status:   Jacquita, Mulhearn [[id number]]  Living    Anwita, Mencer [place] [[id number]]  Living   Apgars:   Naysha, Sholl [[id number]]  8    Nadiyah, Zeis [[id number]]  8  ,   Marielis, Samara Girl Obetz [[id number]]  9    Tahani, Potier [place] [[id number]]  9   Weight:   Marlyce, Mcdougald [[id number]]  2285 g    Nuria, Phebus [[id number]]  2475 g      Magnesium Sulfate received: No BMZ received: No Rhophylac:N/A Transfusion:Yes  Physical exam  Vitals:   01/17/21 0545 01/17/21 1400 01/17/21 2148 01/18/21 0609  BP: 115/62 (!) 107/59 116/64 108/64  Pulse: (!) 59 64 69 66  Resp: 17 18 20 18   Temp: 98.4 F (36.9 C) (!) 97.5 F (36.4 C) 97.9 F (36.6 C) 98.2 F (36.8 C)  TempSrc: Oral Oral Oral Oral  SpO2: 100%  100% 100%  Weight:      Height:       General: alert, cooperative and no distress Lochia: appropriate Uterine Fundus: firm Incision: Healing well with no significant drainage  DVT Evaluation: No evidence of DVT seen on physical exam. Labs: Lab Results  Component Value Date   WBC 11.8 (H) 01/17/2021   HGB 7.0 (L) 01/17/2021   HCT 21.2 (L) 01/17/2021   MCV 80.0 01/17/2021   PLT 189 01/17/2021   CMP Latest Ref Rng & Units 01/24/2017  Glucose 65 - 99 mg/dL 96  BUN 6 - 20 mg/dL 17  Creatinine 6.29 - 4.76 mg/dL 5.46  Sodium 503 - 546 mmol/L 138  Potassium 3.5 - 5.1 mmol/L 3.8  Chloride 101 - 111 mmol/L 103  CO2 22 - 32 mmol/L 27  Calcium 8.9 - 10.3 mg/dL 5.6(C)  Total Protein 6.0 - 8.3 g/dL -  Total Bilirubin 0.3 - 1.2 mg/dL -  Alkaline Phos 39 - 127 U/L -  AST 0 - 37 U/L -  ALT 0 - 35 U/L -   Edinburgh Score: Edinburgh Postnatal Depression Scale Screening Tool 01/16/2021  I have been able to laugh and see the funny side of things. 0  I have looked forward with enjoyment to things. 0  I have blamed myself  unnecessarily when things went wrong. 0  I have been anxious or worried for no good reason. 0  I have felt scared or panicky for no good reason. 0  Things have been getting on top of me. 0  I have been so unhappy that I have had difficulty sleeping. 0  I have felt sad or miserable. 0  I have been so unhappy that I have been crying. 0  The thought of harming myself has occurred to me. 0  Edinburgh Postnatal Depression Scale Total 0      After visit meds:  Allergies as of 01/18/2021   No Known Allergies     Medication List    TAKE these medications   ferrous sulfate 325 (65 FE) MG EC tablet Take 1 tablet (325 mg total) by mouth every other day.   hydrocortisone cream 1 % Apply 1 application topically as needed for itching (hemorrhoids).   ibuprofen 600 MG tablet Commonly known as: ADVIL Take 1 tablet (600 mg total) by mouth every 6 (six) hours as needed for cramping.   oxyCODONE-acetaminophen 5-325 MG tablet Commonly known as: Percocet Take 1 tablet by mouth every 4 (four) hours as needed for up to 7 days for severe pain.   PRENATAL ADULT GUMMY/DHA/FA PO Take 2 tablets by mouth at bedtime.            Discharge Care Instructions  (From admission, onward)         Start     Ordered   01/18/21 0000  Discharge wound care:       Comments: May shower, pat dry   01/18/21 5170           Discharge home in stable condition Infant Feeding: Breast Infant Disposition:home with mother Discharge instruction: per After Visit Summary and Postpartum booklet. Activity: Advance as tolerated. Pelvic rest for 6 weeks.  Diet: routine diet Anticipated Birth Control: Unsure Postpartum Appointment:6 weeks Additional Postpartum F/U: Incision check 1 week Future Appointments:No future appointments. Follow up Visit:  Follow-up Information    Meisinger, Tawanna Cooler, MD. Call.   Specialty: Obstetrics and Gynecology Why: to make appointment for 1 week for incision check and dressing  removal ( per pt request) and 6 weeks for postpartum visit  Contact information: [address] [place] [address] [phone]  01/18/2021 Cathrine Muster, DO

## 2021-01-18 NOTE — Discharge Instructions (Signed)
Call office with any concerns [phone] 

## 2021-01-18 NOTE — Lactation Note (Signed)
This note was copied from a baby's chart. Lactation Consultation Note  Patient Name: Ruth Lin KCMKL'K Date: 01/18/2021 Reason for consult: Follow-up assessment;Early term 37-38.6wks;Infant weight loss Age:26 hours  Baby B - latched on the right breast with depth , swallows noted and fed for 18 mins. Per mom comfortable.  Mom denies soreness or engorgement.  Mom mentioned WIC had called yesterday and she had declined the DEBP .  LC recommended calling them back after seeing the Pedis and asking for a DEBP as prevention. Reasons for a strong plan would be twins, less than 6 pounds , and early term. Dr. Norris Cross into examine  babies and [facility] asked for prescriptions for formula for [facility] due to above reasons so mom will be able to obtain both DEBP and formula. Mom receptive.  Milk is in and per mom pumped off milk.  LC recommended if EBM is available to feed back to babies.  Mom has the [facility] brochure with resources.   Maternal Data    Feeding Mother's Current Feeding Choice: Breast Milk and Formula Nipple Type: Slow - flow  LATCH Score                    Lactation Tools Discussed/Used Tools: Pump;Shells Flange Size: 24;27 Breast pump type: Manual;Double-Electric Breast Pump Pump Education: Milk Storage  Interventions    Discharge Discharge Education: Engorgement and breast care;Warning signs for feeding baby  Consult Status Consult Status: Complete Date: 01/18/21    Kathrin Greathouse 01/18/2021, 9:13 AM

## 2021-01-18 NOTE — Lactation Note (Signed)
This note was copied from a baby's chart. Lactation Consultation Note  Patient Name: Ruth Lin Date: 01/18/2021 Reason for consult: Follow-up assessment;Early term 37-38.6wks;Infant < 6lbs;Infant weight loss;Other (Comment) (Baby A is latched with depth , flanged lips, and swallows/ per ,mom comfortable) Age:26 hours  Maternal Data    Feeding Mother's Current Feeding Choice: Breast Milk and Formula  LATCH Score Latch:  (latched with depth)  Audible Swallowing:  (swallows noted)  Type of Nipple:  (nipple well rounded when baby released)  Comfort (Breast/Nipple):  (per mom comfortable)  Hold (Positioning):  (mom latched by herself)      Lactation Tools Discussed/Used    Interventions Interventions: Breast feeding basics reviewed;Skin to skin;Comfort gels;Hand pump;DEBP;Education;Shells  Discharge Discharge Education: Engorgement and breast care;Warning signs for feeding baby Pump: Manual;DEBP WIC Program: Yes  Consult Status Consult Status: Complete Date: 01/18/21    Kathrin Greathouse 01/18/2021, 9:36 AM

## 2021-01-18 NOTE — Progress Notes (Signed)
Subjective: Postpartum Day 3: Cesarean Delivery Patient reports incisional pain, tolerating PO, + flatus and no problems voiding.  Lochia mild. No complaints of CP, SOB or HA. She is breastfeeding - cramping as expected during this. Bonding well with babies. Nervous about incision care at home . No lightheadedness  Objective: Vital signs in last 24 hours: Temp:  [97.5 F (36.4 C)-98.2 F (36.8 C)] 98.2 F (36.8 C) (03/31 0609) Pulse Rate:  [64-69] 66 (03/31 0609) Resp:  [18-20] 18 (03/31 0609) BP: (107-116)/(59-64) 108/64 (03/31 0609) SpO2:  [100 %] 100 % (03/31 1610)  Physical Exam:  General: alert, cooperative and no distress Lochia: appropriate Uterine Fundus: firm Incision: no significant drainage DVT Evaluation: No evidence of DVT seen on physical exam.  Recent Labs    01/16/21 0811 01/17/21 0745  HGB 6.1* 7.0*  HCT 19.3* 21.2*    Assessment/Plan: Status post Cesarean section. Doing well postoperatively.  Discharge home with standard precautions and return to clinic in 1 week for incision check and dressing removal ( per pt request) and 6 weeks for postpartum visit   Lakina Mcintire W Delilah Mulgrew 01/18/2021, 8:59 AM

## 2021-01-19 DIAGNOSIS — Z419 Encounter for procedure for purposes other than remedying health state, unspecified: Secondary | ICD-10-CM | POA: Diagnosis not present

## 2021-02-18 DIAGNOSIS — Z419 Encounter for procedure for purposes other than remedying health state, unspecified: Secondary | ICD-10-CM | POA: Diagnosis not present

## 2021-03-04 DIAGNOSIS — R404 Transient alteration of awareness: Secondary | ICD-10-CM | POA: Diagnosis not present

## 2021-03-04 DIAGNOSIS — I10 Essential (primary) hypertension: Secondary | ICD-10-CM | POA: Diagnosis not present

## 2021-03-04 DIAGNOSIS — R0902 Hypoxemia: Secondary | ICD-10-CM | POA: Diagnosis not present

## 2021-03-04 DIAGNOSIS — M79605 Pain in left leg: Secondary | ICD-10-CM | POA: Diagnosis not present

## 2021-03-04 DIAGNOSIS — R509 Fever, unspecified: Secondary | ICD-10-CM | POA: Diagnosis not present

## 2021-03-21 DIAGNOSIS — Z419 Encounter for procedure for purposes other than remedying health state, unspecified: Secondary | ICD-10-CM | POA: Diagnosis not present

## 2021-04-20 DIAGNOSIS — Z419 Encounter for procedure for purposes other than remedying health state, unspecified: Secondary | ICD-10-CM | POA: Diagnosis not present

## 2021-05-21 DIAGNOSIS — Z419 Encounter for procedure for purposes other than remedying health state, unspecified: Secondary | ICD-10-CM | POA: Diagnosis not present

## 2021-06-21 DIAGNOSIS — Z419 Encounter for procedure for purposes other than remedying health state, unspecified: Secondary | ICD-10-CM | POA: Diagnosis not present

## 2021-07-21 DIAGNOSIS — Z419 Encounter for procedure for purposes other than remedying health state, unspecified: Secondary | ICD-10-CM | POA: Diagnosis not present

## 2021-08-21 DIAGNOSIS — Z419 Encounter for procedure for purposes other than remedying health state, unspecified: Secondary | ICD-10-CM | POA: Diagnosis not present

## 2021-09-20 DIAGNOSIS — Z419 Encounter for procedure for purposes other than remedying health state, unspecified: Secondary | ICD-10-CM | POA: Diagnosis not present

## 2021-10-21 DIAGNOSIS — Z419 Encounter for procedure for purposes other than remedying health state, unspecified: Secondary | ICD-10-CM | POA: Diagnosis not present

## 2021-11-21 DIAGNOSIS — Z419 Encounter for procedure for purposes other than remedying health state, unspecified: Secondary | ICD-10-CM | POA: Diagnosis not present

## 2021-12-19 DIAGNOSIS — Z419 Encounter for procedure for purposes other than remedying health state, unspecified: Secondary | ICD-10-CM | POA: Diagnosis not present

## 2022-01-19 DIAGNOSIS — Z419 Encounter for procedure for purposes other than remedying health state, unspecified: Secondary | ICD-10-CM | POA: Diagnosis not present

## 2022-02-01 DIAGNOSIS — R350 Frequency of micturition: Secondary | ICD-10-CM | POA: Diagnosis not present

## 2022-02-01 DIAGNOSIS — Z202 Contact with and (suspected) exposure to infections with a predominantly sexual mode of transmission: Secondary | ICD-10-CM | POA: Diagnosis not present

## 2022-02-01 DIAGNOSIS — N898 Other specified noninflammatory disorders of vagina: Secondary | ICD-10-CM | POA: Diagnosis not present

## 2022-02-18 DIAGNOSIS — Z419 Encounter for procedure for purposes other than remedying health state, unspecified: Secondary | ICD-10-CM | POA: Diagnosis not present

## 2022-03-21 DIAGNOSIS — Z419 Encounter for procedure for purposes other than remedying health state, unspecified: Secondary | ICD-10-CM | POA: Diagnosis not present

## 2022-04-20 DIAGNOSIS — Z419 Encounter for procedure for purposes other than remedying health state, unspecified: Secondary | ICD-10-CM | POA: Diagnosis not present

## 2022-05-21 DIAGNOSIS — Z419 Encounter for procedure for purposes other than remedying health state, unspecified: Secondary | ICD-10-CM | POA: Diagnosis not present

## 2022-06-05 ENCOUNTER — Encounter (HOSPITAL_COMMUNITY): Payer: Self-pay | Admitting: Emergency Medicine

## 2022-06-05 ENCOUNTER — Emergency Department (HOSPITAL_COMMUNITY)
Admission: EM | Admit: 2022-06-05 | Discharge: 2022-06-06 | Disposition: A | Discharge status: Home / Self Care | Payer: Medicaid Other | Attending: Emergency Medicine | Admitting: Emergency Medicine

## 2022-06-05 ENCOUNTER — Other Ambulatory Visit: Payer: Self-pay

## 2022-06-05 DIAGNOSIS — K6289 Other specified diseases of anus and rectum: Secondary | ICD-10-CM | POA: Insufficient documentation

## 2022-06-05 DIAGNOSIS — K5900 Constipation, unspecified: Secondary | ICD-10-CM | POA: Insufficient documentation

## 2022-06-05 LAB — CBC WITH DIFFERENTIAL/PLATELET
Abs Immature Granulocytes: 0.02 10*3/uL (ref 0.00–0.07)
Basophils Absolute: 0 10*3/uL (ref 0.0–0.1)
Basophils Relative: 1 %
Eosinophils Absolute: 0 10*3/uL (ref 0.0–0.5)
Eosinophils Relative: 1 %
HCT: 40.2 % (ref 36.0–46.0)
Hemoglobin: 13.3 g/dL (ref 12.0–15.0)
Immature Granulocytes: 0 %
Lymphocytes Relative: 34 %
Lymphs Abs: 2 10*3/uL (ref 0.7–4.0)
MCH: 28.7 pg (ref 26.0–34.0)
MCHC: 33.1 g/dL (ref 30.0–36.0)
MCV: 86.6 fL (ref 80.0–100.0)
Monocytes Absolute: 0.3 10*3/uL (ref 0.1–1.0)
Monocytes Relative: 6 %
Neutro Abs: 3.3 10*3/uL (ref 1.7–7.7)
Neutrophils Relative %: 58 %
Platelets: 286 10*3/uL (ref 150–400)
RBC: 4.64 MIL/uL (ref 3.87–5.11)
RDW: 12.7 % (ref 11.5–15.5)
WBC: 5.7 10*3/uL (ref 4.0–10.5)
nRBC: 0 % (ref 0.0–0.2)

## 2022-06-05 LAB — URINALYSIS, ROUTINE W REFLEX MICROSCOPIC
Bilirubin Urine: NEGATIVE
Glucose, UA: NEGATIVE mg/dL
Hgb urine dipstick: NEGATIVE
Ketones, ur: NEGATIVE mg/dL
Nitrite: NEGATIVE
Protein, ur: NEGATIVE mg/dL
Specific Gravity, Urine: 1.009 (ref 1.005–1.030)
pH: 6 (ref 5.0–8.0)

## 2022-06-05 LAB — I-STAT BETA HCG BLOOD, ED (MC, WL, AP ONLY): I-stat hCG, quantitative: <5 m[IU]/mL (ref <5)

## 2022-06-05 LAB — COMPREHENSIVE METABOLIC PANEL
ALT: 16 U/L (ref 0–44)
AST: 21 U/L (ref 15–41)
Albumin: 4.1 g/dL (ref 3.5–5.0)
Alkaline Phosphatase: 75 U/L (ref 38–126)
Anion gap: 7 (ref 5–15)
BUN: 14 mg/dL (ref 6–20)
CO2: 27 mmol/L (ref 22–32)
Calcium: 9.4 mg/dL (ref 8.9–10.3)
Chloride: 106 mmol/L (ref 98–111)
Creatinine, Ser: 0.69 mg/dL (ref 0.44–1.00)
GFR, Estimated: >60 mL/min (ref >60)
Glucose, Bld: 88 mg/dL (ref 70–99)
Potassium: 4.2 mmol/L (ref 3.5–5.1)
Sodium: 140 mmol/L (ref 135–145)
Total Bilirubin: 0.4 mg/dL (ref 0.3–1.2)
Total Protein: 7.5 g/dL (ref 6.5–8.1)

## 2022-06-05 LAB — LIPASE, BLOOD: Lipase: 38 U/L (ref 11–51)

## 2022-06-05 NOTE — ED Provider Triage Note (Signed)
Emergency Medicine Provider Triage Evaluation Note  Ruth Lin , a 26 y.o. female  was evaluated in triage.  Pt complains of rectal pain abdominal pain.  Patient reports that she has been dealing with rectal pain since 2021.  Patient reports that she has been seen in the past for her primary care doctor and diagnosed with hemorrhoids.  Patient states that she is here today because she needs a CT scan.  Patient also reports that she has noticed periumbilical pain and abdominal bloating.  Patient is unsure how long this has been going on for.  Patient reports that she has not had a menstrual period since 2021 due to breast-feeding.  Review of Systems  Positive: Rectal pain, abdominal pain, Negative: Dysuria, hematuria, urinary urgency, urinary frequency, vaginal pain, vaginal bleeding, vaginal discharge, nausea, vomiting, fever, chills, blood in stool, melena.  Physical Exam  BP (!) 124/91   Pulse 75   Temp 98.8 F (37.1 C) (Oral)   Resp 16   SpO2 100%  Gen:   Awake, no distress   Resp:  Normal effort  MSK:   Moves extremities without difficulty  Other:  Abdomen soft, nondistended, nontender no guarding or rebound tenderness.  Medical Decision Making  Medically screening exam initiated at 1:44 PM.  Appropriate orders placed.  Sharan Heddy was informed that the remainder of the evaluation will be completed by another provider, this initial triage assessment does not replace that evaluation, and the importance of remaining in the ED until their evaluation is complete.     Haskel Schroeder, [place] 06/05/22 1347

## 2022-06-05 NOTE — ED Triage Notes (Signed)
Pt presents with rectal pain x years.  Pt states she has hemorrhoids and has been evaluated by her doctor but feels she may have cancer and would like a CT scan.  Endorses abdominal pain.  Explained CT scans will be ordered if indicated by work up.

## 2022-06-06 ENCOUNTER — Encounter: Payer: Self-pay | Admitting: Physician Assistant

## 2022-06-06 NOTE — ED Provider Notes (Signed)
[facility] EMERGENCY DEPARTMENT Provider Note   CSN: [id number] Arrival date & time: 06/05/22  1243     History  Chief Complaint  Patient presents with   Rectal Pain    Ruth Lin is a 26 y.o. female.  HPI Patient is otherwise healthy 26 year old who presents with rectal pain for several years.  She reports ongoing chronic constipation that has not improved with medications.  She also reports ongoing bloating of unclear timeframe.  No fevers or vomiting.  She also reports over 20 pound weight loss over 3 years. She reports fatigue and bruising of unclear etiology.  Reports a previous history of "low iron" but is no longer on medications She reports she has been seen for the rectal pain previously but not been given any diagnosis.  She reports her father recently underwent colonoscopy and had multiple polyps removed that were noncancerous.  Patient is concerned that she may have cancer Patient is requesting a CT scan   Past Medical History:  Diagnosis Date   Anemia    Hemorrhoid    SVD (spontaneous vaginal delivery) 08/11/2018    Home Medications Prior to Admission medications   Not on File      Allergies    Patient has no known allergies.    Review of Systems   Review of Systems  Constitutional:  Positive for fatigue. Negative for fever.  Gastrointestinal:  Positive for constipation, nausea and rectal pain. Negative for blood in stool and vomiting.  Genitourinary:  Negative for dysuria and vaginal bleeding.  Skin:        bruising    Physical Exam Updated Vital Signs BP 110/61 (BP Location: Right Arm)   Pulse 70   Temp 98.1 F (36.7 C) (Oral)   Resp 18   SpO2 100%  Physical Exam CONSTITUTIONAL: Well developed/well nourished HEAD: Normocephalic/atraumatic EYES: EOMI/PERRL, no icterus ENMT: Mucous membranes moist NECK: supple no meningeal signs CV: S1/S2 noted, no murmurs/rubs/gallops noted LUNGS: Lungs are clear to auscultation  bilaterally, no apparent distress ABDOMEN: soft, nontender, no rebound or guarding, bowel sounds noted throughout abdomen Rectal -exam performed with nurse Suni Batten present Small external skin tag.  No large hemorrhoids noted.  No rectal mass, no abscess.  No blood or melena is noted NEURO: Pt is awake/alert/appropriate, moves all extremitiesx4.  No facial droop.   EXTREMITIES: pulses normal/equal, full ROM SKIN: warm, color normal PSYCH: Mildly anxious ED Results / Procedures / Treatments   Labs (all labs ordered are listed, but only abnormal results are displayed) Labs Reviewed  URINALYSIS, ROUTINE W REFLEX MICROSCOPIC - Abnormal; Notable for the following components:      Result Value   Color, Urine STRAW (*)    Leukocytes,Ua LARGE (*)    Bacteria, UA RARE (*)    Non Squamous Epithelial 0-5 (*)    All other components within normal limits  COMPREHENSIVE METABOLIC PANEL  CBC WITH DIFFERENTIAL/PLATELET  LIPASE, BLOOD  I-STAT BETA HCG BLOOD, ED ([facility], [facility], [facility] ONLY)    EKG None  Radiology No results found.  Procedures Procedures    Medications Ordered in ED Medications - No data to display  ED Course/ Medical Decision Making/ A&P                           Medical Decision Making  Patient reports constipation and rectal pain.  This has  been ongoing for quite some time.  Patient is concerned about cancer and is  requesting CT scan. Given her exam, history and overall clinical scenario, low suspicion for any type of malignancy at this time.  Given her ongoing constipation, bloating and rectal pain, she may benefit from outpatient gastroenterology follow-up.  We also discussed appropriate management of constipation. Patient has no focal abdominal tenderness, no vomiting at this time, therefore no indication for emergent CT scan.  She denies any urinary symptoms to suggest UTI.  Patient is safe for discharge home.  Reassurance given         Final Clinical Impression(s) / ED  Diagnoses Final diagnoses:  Rectal pain  Constipation, unspecified constipation type    Rx / DC Orders ED Discharge Orders          Ordered    Ambulatory referral to Gastroenterology       Comments: Persistent rectal pain, constipation   06/06/22 0342              Zadie Rhine, MD 06/06/22 0422

## 2022-06-17 DIAGNOSIS — R300 Dysuria: Secondary | ICD-10-CM | POA: Diagnosis not present

## 2022-06-17 DIAGNOSIS — R102 Pelvic and perineal pain: Secondary | ICD-10-CM | POA: Diagnosis not present

## 2022-06-21 DIAGNOSIS — Z419 Encounter for procedure for purposes other than remedying health state, unspecified: Secondary | ICD-10-CM | POA: Diagnosis not present

## 2022-07-08 NOTE — Progress Notes (Unsigned)
07/09/2022 Ruth Lin [id number] [birth year]  Referring provider: Ripley Fraise, MD Primary GI doctor: Dr. Hilarie Fredrickson  ASSESSMENT AND PLAN:   Assessment: 26 y.o. female here for assessment of the following: 1. Rectal bleeding   2. Chronic idiopathic constipation   3. Nausea and vomiting, unspecified vomiting type   4. Generalized abdominal pain    26 year old female with 5 children, last pregnancy was with twins resulting in C-section March 2022. Baseline is constipation, however she had worsening constipation, rectal bleeding and rectal pain since her first pregnancy in 2014. Has seen GYN, completed pelvic floor physical therapy, had pelvic ultrasound which was unremarkable.  Has tried MiraLAX, Colace, fiber, teas without help. On physical exam patient has obvious internal hemorrhoids but also very tender examination, negative Hemoccult, no stool in the vault. Most likely with patient's history it does sound more still like pelvic floor dysfunction/IBS C however this is deeply affecting the quality of her life, she also has nausea and vomiting worsening so after long discussion about risk versus benefits she wishes to proceed with endoscopic evaluation.  Plan: We will plan for upper endoscopy as well as colonoscopy to evaluate. We will get labs to evaluate for IBD, thyroid, celiac. Test for H. pylori during endoscopy. We will do 2-day prep with constipation history. We will get KUB to evaluate for obstruction with some of her nausea and vomiting symptoms though on exam does not appear to have any distention or obstruction. If KUB is negative can start on Linzess samples 145, patient is breast-feeding however checked on up-to-date and appears this is not in breast milk, had discussion with patient she is willing to try. Can also take MiraLAX with it, may ultimately need imaging with CT to evaluate versus anal rectal manometry. Patient declined hydrocortisone suppositories, states she  has tried these and they hurt too much, can do sitz bath, RectiCare samples, may need hemorrhoidal banding pending colonoscopy versus surgical referral.  Orders Placed This Encounter  Procedures   DG Abd 2 Views   TSH   Sedimentation rate   High sensitivity CRP   Tissue transglutaminase, IgA   IgA   Ambulatory referral to Gastroenterology    Meds ordered this encounter  Medications   linaclotide (LINZESS) 145 MCG CAPS capsule    Sig: Take 1 capsule (145 mcg total) by mouth daily before breakfast.    Dispense:  90 capsule    Refill:  3   Na Sulfate-K Sulfate-Mg Sulf 17.5-3.13-1.6 GM/177ML SOLN    Sig: Take 1 kit by mouth once for 1 dose.    Dispense:  354 mL    Refill:  0     Patient Care Team: Alroy Dust, L.Marlou Sa, MD as PCP - General (Family Medicine)  HISTORY OF PRESENT ILLNESS: 26 y.o. female with a past medical history of hemorrhoid, anemia, and others listed below presents for evaluation of rectal pain with constipation and rectal bleeding.   She has baseline constipation, with BM every other day.  States since first pregnancy in 2014 had hard stools and rectal bleeding with hemorrhoid then.  She has had AB pain and rectal pain since C section March of 2022, she has a lot of pressure.  She states when she bends over she has cramping in her AB and will feel a bulge in anterior AB with pressure and pain, better with sitting up right or putting pressure on it.  Worse with playing with kids, has to rest due to pressure.  She is getting frequent  UTI's, she has had pelvic US that was normal, she has some blood in urine occ, she has completed pelvic floor PT without help.  She has had nausea since the Csection, decreased appetite, does not want breakfast. She feels full with bloating in her AB.  Twice a week she will vomit, with bending over or occ with the pain.  Will have BRB blood on TP and in the bowel movements.  She states she is constantly tired, with dizziness, some SOB, no  chest pain.  Denies GERD unless she eats at night. Feels food slowly passes through.  She states she will pass a little bit but not have complete BM.  She is having a BM once a week.  She states she feels she is bruising easier. She is on miralax occ, she will be on miralax 2 weeks but states not helpful. Dandilaion tea not helpful. Colace not helpful. Increasing water and fiber not helping.   Labs from 06/05/2022 show negative lipase, normal CBC without anemia, and unremarkable kidney/liver.   Her dad has had colon polyps, no family history of colon cancer or autoimmune.  She has total of 5 children with one set of twins girls this past C section in March.    She  reports that she has never smoked. She has never used smokeless tobacco. She reports that she does not drink alcohol and does not use drugs.  Current Medications:        Current Outpatient Medications (Other):    linaclotide (LINZESS) 145 MCG CAPS capsule, Take 1 capsule (145 mcg total) by mouth daily before breakfast.   Na Sulfate-K Sulfate-Mg Sulf 17.5-3.13-1.6 GM/177ML SOLN, Take 1 kit by mouth once for 1 dose.  Medical History:  Past Medical History:  Diagnosis Date   Anemia    Hemorrhoid    SVD (spontaneous vaginal delivery) 08/11/2018   Allergies: No Known Allergies   Surgical History:  She  has a past surgical history that includes No past surgeries; Wisdom tooth extraction (Bilateral, 2017); and Cesarean section multi-gestational (N/A, 01/15/2021). Family History:  Her family history includes Colon polyps in her father; Diabetes in her maternal grandfather.  REVIEW OF SYSTEMS  : All other systems reviewed and negative except where noted in the History of Present Illness.  PHYSICAL EXAM: BP 118/80   Pulse 80   Ht '5\' 1"'  (1.549 m)   Wt 130 lb (59 kg)   BMI 24.56 kg/m  General:   Pleasant, well developed female in no acute distress Head:   Normocephalic and atraumatic. Eyes:  sclerae  anicteric,conjunctive pink  Heart:   regular rate and rhythm Pulm:  Clear anteriorly; no wheezing Abdomen:   Patient without distention, with loose skin, diastasis recti, no appreciated hernia, epigastric tenderness, no right upper quadrant tenderness, lower abdominal tenderness worse right lower quadrant without rebound.  Patient does have guarding.  No organomegaly. Rectal: Very small nonthrombosed external hemorrhoids, very large appreciated internal hemorrhoids very tender, no fissure appreciated, difficult exam due to tenderness, no stool appreciated in the vault, negative Hemoccult Extremities:  Without edema. Msk: Symmetrical without gross deformities. Peripheral pulses intact.  Neurologic:  Alert and  oriented x4;  No focal deficits.  Skin:   Dry and intact without significant lesions or rashes. Psychiatric:  Cooperative. Normal mood and affect.    Vladimir Crofts, PA-C 9:11 AM

## 2022-07-09 ENCOUNTER — Encounter: Payer: Self-pay | Admitting: Physician Assistant

## 2022-07-09 ENCOUNTER — Other Ambulatory Visit (INDEPENDENT_AMBULATORY_CARE_PROVIDER_SITE_OTHER): Payer: Managed Care, Other (non HMO)

## 2022-07-09 ENCOUNTER — Ambulatory Visit (INDEPENDENT_AMBULATORY_CARE_PROVIDER_SITE_OTHER): Payer: Managed Care, Other (non HMO) | Admitting: Physician Assistant

## 2022-07-09 ENCOUNTER — Ambulatory Visit
Admission: RE | Admit: 2022-07-09 | Discharge: 2022-07-09 | Disposition: A | Discharge status: Home / Self Care | Payer: Managed Care, Other (non HMO) | Source: Ambulatory Visit | Attending: Physician Assistant | Admitting: Physician Assistant

## 2022-07-09 VITALS — BP 118/80 | HR 80 | Ht 61.0 in | Wt 130.0 lb

## 2022-07-09 DIAGNOSIS — R1084 Generalized abdominal pain: Secondary | ICD-10-CM | POA: Diagnosis not present

## 2022-07-09 DIAGNOSIS — K5904 Chronic idiopathic constipation: Secondary | ICD-10-CM | POA: Diagnosis not present

## 2022-07-09 DIAGNOSIS — R112 Nausea with vomiting, unspecified: Secondary | ICD-10-CM | POA: Diagnosis not present

## 2022-07-09 DIAGNOSIS — K625 Hemorrhage of anus and rectum: Secondary | ICD-10-CM

## 2022-07-09 LAB — TSH: TSH: 1.85 u[IU]/mL (ref 0.35–5.50)

## 2022-07-09 LAB — HIGH SENSITIVITY CRP: CRP, High Sensitivity: 1.08 mg/L (ref 0.000–5.000)

## 2022-07-09 LAB — SEDIMENTATION RATE: Sed Rate: 12 mm/hr (ref 0–20)

## 2022-07-09 MED ORDER — LINACLOTIDE 145 MCG PO CAPS: 145.0000 ug | ORAL_CAPSULE | Freq: Every day | ORAL | 3 refills | Status: AC

## 2022-07-09 MED ORDER — NA SULFATE-K SULFATE-MG SULF 17.5-3.13-1.6 GM/177ML PO SOLN: 1.0000 | Freq: Once | ORAL | 0 refills | Status: AC

## 2022-07-09 NOTE — Patient Instructions (Addendum)
Your provider has requested that you go to the basement level for lab work before leaving today. Press "B" on the elevator. The lab is located at the first door on the left as you exit the elevator.  Your provider has requested that you have an abdominal x ray before leaving today. Please go to the basement floor to our Radiology department for the test.   You have been scheduled for an endoscopy and colonoscopy. Please follow the written instructions given to you at your visit today. Please pick up your prep supplies at the pharmacy within the next 1-3 days. If you use inhalers (even only as needed), please bring them with you on the day of your procedure.   We have sent the following medications to your pharmacy for you to pick up at your convenience: Linzess 145 mcg & Suprep   Linzess START AFTER XRAY Take at least 30 minutes before the first meal of the day on an empty stomach You can have a loose stool if you eat a high-fat breakfast. Give it at least 4 days, may have more bowel movements during that time.  If you continue to have severe diarrhea try every other day, if you get AB pain with it stop and call our office.  After you are out we can send in a prescription if you did well, there is a prescription card  CAN TAKE WITH MIRALAX IF NEEDED Miralax is an osmotic laxative.  It only brings more water into the stool.  This is safe to take daily.  Can take up to 17 gram of miralax twice a day.  Mix with juice or coffee.  Start 1 capful at night for 3-4 days and reassess your response in 3-4 days.  You can increase and decrease the dose based on your response.  Remember, it can take up to 3-4 days to take effect OR for the effects to wear off.   I often pair this with benefiber in the morning to help assure the stool is not too loose.    Recommend starting on a fiber supplement, can try metamucil first but if this causes gas/bloating switch to benefiber or citracel, these do not  cause gas.  Take with fiber with with a full 8 oz glass of water once a day. This can take 1 month to start helping, so try for at least one month.  Recommend increasing water and physical activity.   - Drink at least 64-80 ounces of water/liquid per day. - Establish a time to try to move your bowels every day.  For many people, this is after a cup of coffee or after a meal such as breakfast. - Sit all of the way back on the toilet keeping your back fairly straight and while sitting up, try to rest the tops of your forearms on your upper thighs.   - Raising your feet with a step stool/squatty potty can be helpful to improve the angle that allows your stool to pass through the rectum. - Relax the rectum feeling it bulge toward the toilet water.  If you feel your rectum raising toward your body, you are contracting rather than relaxing. - Breathe in and slowly exhale. "Belly breath" by expanding your belly towards your belly button. Keep belly expanded as you gently direct pressure down and back to the anus.  A low pitched GRRR sound can assist with increasing intra-abdominal pressure.  - Repeat 3-4 times. If unsuccessful, contract the pelvic floor to restore  normal tone and get off the toilet.  Avoid excessive straining. - To reduce excessive wiping by teaching your anus to normally contract, place hands on outer aspect of knees and resist knee movement outward.  Hold 5-10 second then place hands just inside of knees and resist inward movement of knees.  Hold 5 seconds.  Repeat a few times each way.  Go to the ER if unable to pass gas, severe AB pain, unable to hold down food, any shortness of breath of chest pain.  Due to recent changes in healthcare laws, you may see the results of your imaging and laboratory studies on MyChart before your provider has had a chance to review them.  We understand that in some cases there may be results that are confusing or concerning to you. Not all laboratory  results come back in the same time frame and the provider may be waiting for multiple results in order to interpret others.  Please give [place] 48 hours in order for your provider to thoroughly review all the results before contacting the office for clarification of your results.    It was a pleasure to see you today!  Thank you for trusting me with your gastrointestinal care!

## 2022-07-10 LAB — TISSUE TRANSGLUTAMINASE, IGA: (tTG) Ab, IgA: <1 U/mL

## 2022-07-10 LAB — IGA: Immunoglobulin A: 315 mg/dL — ABNORMAL HIGH (ref 47–310)

## 2022-07-16 NOTE — Progress Notes (Signed)
Addendum: Reviewed and agree with assessment and management plan. Georgene Kopper M, MD  

## 2022-07-18 ENCOUNTER — Telehealth: Payer: Self-pay | Admitting: Physician Assistant

## 2022-07-18 DIAGNOSIS — R350 Frequency of micturition: Secondary | ICD-10-CM | POA: Diagnosis not present

## 2022-07-18 NOTE — Telephone Encounter (Signed)
Patient called in with complaints of blood in her urine, burning while voiding, and lower abdominal pain. She has an appointment today with her PCP, advised that she keep that as scheduled to rule out UTI. We also discussed her recent abdominal x ray results & advised she continue linzess as planned. Pt verbalized all understanding.

## 2022-07-18 NOTE — Telephone Encounter (Signed)
Inbound call from patient stating she has blood in her urine. She is wondering if she should stop taking linzess prior to her upcoming procedure. Please advise.

## 2022-07-20 ENCOUNTER — Ambulatory Visit (INDEPENDENT_AMBULATORY_CARE_PROVIDER_SITE_OTHER): Payer: Managed Care, Other (non HMO)

## 2022-07-20 ENCOUNTER — Encounter: Payer: Self-pay | Admitting: Nurse Practitioner

## 2022-07-20 ENCOUNTER — Telehealth: Payer: Managed Care, Other (non HMO) | Admitting: Nurse Practitioner

## 2022-07-20 ENCOUNTER — Ambulatory Visit (HOSPITAL_COMMUNITY)
Admission: EM | Admit: 2022-07-20 | Discharge: 2022-07-20 | Disposition: A | Discharge status: Home / Self Care | Payer: Managed Care, Other (non HMO) | Attending: Emergency Medicine | Admitting: Emergency Medicine

## 2022-07-20 ENCOUNTER — Other Ambulatory Visit: Payer: Self-pay

## 2022-07-20 ENCOUNTER — Emergency Department (HOSPITAL_COMMUNITY)
Admission: EM | Admit: 2022-07-20 | Discharge: 2022-07-21 | Disposition: A | Discharge status: Home / Self Care | Payer: Managed Care, Other (non HMO) | Attending: Emergency Medicine | Admitting: Emergency Medicine

## 2022-07-20 ENCOUNTER — Encounter (HOSPITAL_COMMUNITY): Payer: Self-pay

## 2022-07-20 DIAGNOSIS — M545 Low back pain, unspecified: Secondary | ICD-10-CM

## 2022-07-20 DIAGNOSIS — N3000 Acute cystitis without hematuria: Secondary | ICD-10-CM

## 2022-07-20 DIAGNOSIS — R110 Nausea: Secondary | ICD-10-CM | POA: Diagnosis not present

## 2022-07-20 DIAGNOSIS — N3001 Acute cystitis with hematuria: Secondary | ICD-10-CM

## 2022-07-20 DIAGNOSIS — M549 Dorsalgia, unspecified: Secondary | ICD-10-CM | POA: Diagnosis present

## 2022-07-20 DIAGNOSIS — U071 COVID-19: Secondary | ICD-10-CM | POA: Diagnosis not present

## 2022-07-20 DIAGNOSIS — N9489 Other specified conditions associated with female genital organs and menstrual cycle: Secondary | ICD-10-CM | POA: Insufficient documentation

## 2022-07-20 DIAGNOSIS — R109 Unspecified abdominal pain: Secondary | ICD-10-CM | POA: Diagnosis not present

## 2022-07-20 DIAGNOSIS — M546 Pain in thoracic spine: Secondary | ICD-10-CM | POA: Diagnosis not present

## 2022-07-20 DIAGNOSIS — R918 Other nonspecific abnormal finding of lung field: Secondary | ICD-10-CM

## 2022-07-20 DIAGNOSIS — K429 Umbilical hernia without obstruction or gangrene: Secondary | ICD-10-CM | POA: Diagnosis not present

## 2022-07-20 DIAGNOSIS — R911 Solitary pulmonary nodule: Secondary | ICD-10-CM | POA: Diagnosis not present

## 2022-07-20 DIAGNOSIS — R319 Hematuria, unspecified: Secondary | ICD-10-CM | POA: Diagnosis not present

## 2022-07-20 LAB — URINALYSIS, ROUTINE W REFLEX MICROSCOPIC
Bilirubin Urine: NEGATIVE
Glucose, UA: NEGATIVE mg/dL
Hgb urine dipstick: NEGATIVE
Ketones, ur: NEGATIVE mg/dL
Leukocytes,Ua: NEGATIVE
Nitrite: NEGATIVE
Protein, ur: NEGATIVE mg/dL
Specific Gravity, Urine: 1.011 (ref 1.005–1.030)
pH: 6 (ref 5.0–8.0)

## 2022-07-20 LAB — BASIC METABOLIC PANEL
Anion gap: 10 (ref 5–15)
BUN: 12 mg/dL (ref 6–20)
CO2: 24 mmol/L (ref 22–32)
Calcium: 9 mg/dL (ref 8.9–10.3)
Chloride: 103 mmol/L (ref 98–111)
Creatinine, Ser: 1.02 mg/dL — ABNORMAL HIGH (ref 0.44–1.00)
GFR, Estimated: >60 mL/min (ref >60)
Glucose, Bld: 89 mg/dL (ref 70–99)
Potassium: 4.2 mmol/L (ref 3.5–5.1)
Sodium: 137 mmol/L (ref 135–145)

## 2022-07-20 LAB — CBC
HCT: 40.7 % (ref 36.0–46.0)
Hemoglobin: 13.5 g/dL (ref 12.0–15.0)
MCH: 29 pg (ref 26.0–34.0)
MCHC: 33.2 g/dL (ref 30.0–36.0)
MCV: 87.5 fL (ref 80.0–100.0)
Platelets: 214 10*3/uL (ref 150–400)
RBC: 4.65 MIL/uL (ref 3.87–5.11)
RDW: 13.1 % (ref 11.5–15.5)
WBC: 4.8 10*3/uL (ref 4.0–10.5)
nRBC: 0 % (ref 0.0–0.2)

## 2022-07-20 LAB — I-STAT BETA HCG BLOOD, ED (MC, WL, AP ONLY): I-stat hCG, quantitative: <5 m[IU]/mL (ref <5)

## 2022-07-20 MED ORDER — PREDNISONE 20 MG PO TABS: 40.0000 mg | ORAL_TABLET | Freq: Every day | ORAL | 0 refills | Status: AC

## 2022-07-20 MED ORDER — ACETAMINOPHEN 325 MG PO TABS
650.0000 mg | ORAL_TABLET | Freq: Once | ORAL | Status: AC
  Administered 2022-07-20: 650 mg via ORAL
  Filled 2022-07-20: qty 2

## 2022-07-20 MED ORDER — CYCLOBENZAPRINE HCL 5 MG PO TABS: 5.0000 mg | ORAL_TABLET | Freq: Three times a day (TID) | ORAL | 0 refills | Status: AC | PRN

## 2022-07-20 NOTE — ED Triage Notes (Signed)
Pt here from home via [facility] for worsening back pain. Pt was diagnosed w uti 3 days ago, placed on antibiotics. Pt then tested positive 9/27 and went to UC today and they gave her a muscle relaxer for back pain, and told her if it got worse to go to the ER and get a CT  scan. VSS

## 2022-07-20 NOTE — Discharge Instructions (Signed)
At this time the cause of your severe back pain is unknown, it could be a result of concurrent infection such as UTI and the COVID, it appears that your urinary symptoms have already begun to improve once you have started the antibiotic continue taking as recommended  Today we will help to minimize your pain  Begin use of prednisone every morning with food for 5 days, this helps to reduce inflammation which in turn will help with your pain, you may take Tylenol in addition to this  You may use muscle relaxer every 8 hours as needed for additional comfort, be mindful this medication may make you drowsy  You may use heating pad in 15 minute intervals as needed for additional comfort,you may find comfort in using ice in 10-15 minutes over affected area  Begin stretching affected area daily for 10 minutes as tolerated to further loosen muscles   When lying down place pillow underneath and between knees for support   If your back pain continues to persist or worsens in severity please go to the nearest emergency department

## 2022-07-20 NOTE — ED Provider Triage Note (Signed)
Emergency Medicine Provider Triage Evaluation Note  Ruth Lin , a 26 y.o. female  was evaluated in triage.  Pt complains of hematuria, nausea, flank pain ongoing for 2 days.  Patient initially had abdominal pressure, hematuria, and dysuria and her PCP prescribed her Bactrim for a possible UTI.  Patient states that the hematuria and dysuria have resolved.  Patient also complains of body aches, fever, chills, sore throat and tested positive for COVID at home.  Patient is currently lactating.  Patient was seen in urgent care today due to centralized and right-sided back pain.  Urgent care recommended the patient come to the emergency department for CT scan.  Review of Systems  Positive: As above Negative: As above  Physical Exam  BP 124/76   Pulse 98   Temp (!) 100.5 F (38.1 C) (Oral)   Resp 16   LMP  (LMP Unknown)   SpO2 99%  Gen:   Awake, no distress   Resp:  Normal effort  MSK:   Moves extremities without difficulty  Other:    Medical Decision Making  Medically screening exam initiated at 9:35 PM.  Appropriate orders placed.  Ruth Lin was informed that the remainder of the evaluation will be completed by another provider, this initial triage assessment does not replace that evaluation, and the importance of remaining in the ED until their evaluation is complete.     Ruth Lin, Ruth Lin 07/20/22 2137

## 2022-07-20 NOTE — Progress Notes (Signed)
Virtual Visit Consent   Ruth Lin, you are scheduled for a virtual visit with a [facility] provider today. Just as with appointments in the office, your consent must be obtained to participate. Your consent will be active for this visit and any virtual visit you may have with one of our providers in the next 365 days. If you have a MyChart account, a copy of this consent can be sent to you electronically.  As this is a virtual visit, video technology does not allow for your provider to perform a traditional examination. This may limit your provider's ability to fully assess your condition. If your provider identifies any concerns that need to be evaluated in person or the need to arrange testing (such as labs, EKG, etc.), we will make arrangements to do so. Although advances in technology are sophisticated, we cannot ensure that it will always work on either your end or our end. If the connection with a video visit is poor, the visit may have to be switched to a telephone visit. With either a video or telephone visit, we are not always able to ensure that we have a secure connection.  By engaging in this virtual visit, you consent to the provision of healthcare and authorize for your insurance to be billed (if applicable) for the services provided during this visit. Depending on your insurance coverage, you may receive a charge related to this service.  I need to obtain your verbal consent now. Are you willing to proceed with your visit today? Ruth Lin has provided verbal consent on 07/20/2022 for a virtual visit (video or telephone). Claiborne Rigg, NP  Date: 07/20/2022 9:20 AM  Virtual Visit via Video Note   I, SALAM CHESTERFIELD, connected with  Jaeliana Lococo  ([id number], July 30, 1996) on 07/20/22 at  9:00 AM EDT by a video-enabled telemedicine application and verified that I am speaking with the correct person using two identifiers.  Location: Patient: Virtual Visit Location Patient: Home Provider:  Virtual Visit Location Provider: Home Office   I discussed the limitations of evaluation and management by telemedicine and the availability of in person appointments. The patient expressed understanding and agreed to proceed.    History of Present Illness: Ruth Lin is a 26 y.o. who identifies as a female who was assigned female at birth, and is being seen today for Worsening UTI symptoms.  Ms. Avika was last seen by her PCP on 07-18-2022 with c/o pelvic pain, frequent urination, urgency or urination, blood in urine, fatigue, joint pain, light headedness and sore throat. Tmax 102.8. She was prescribed bactrim 2 days ago and also instructed to go to urgent care if her symptoms worsened. Today she reports worsening back pain, hematuria and now COVID positive. I have instructed her to go to  urgent care as recommended 2 days ago by her PCP as she may need to be evaluated for kidney stones.    Problems:  Patient Active Problem List   Diagnosis Date Noted   Dichorionic diamniotic twin pregnancy in third trimester 01/15/2021   S/P cesarean section 01/15/2021   Normal pregnancy in multigravida in third trimester 08/11/2018   SVD (spontaneous vaginal delivery) 08/11/2018    Allergies: No Known Allergies Medications:  Current Outpatient Medications:    linaclotide (LINZESS) 145 MCG CAPS capsule, Take 1 capsule (145 mcg total) by mouth daily before breakfast., Disp: 90 capsule, Rfl: 3  Observations/Objective: Patient is well-developed, well-nourished in no acute distress.  Resting comfortably at home.  Head is  normocephalic, atraumatic.  No labored breathing.  Speech is clear and coherent with logical content.  Patient is alert and oriented at baseline.    Assessment and Plan: 1. Acute cystitis with hematuria Needs face to face visit at urgent care  Follow Up Instructions: I discussed the assessment and treatment plan with the patient. The patient was provided an opportunity to ask questions  and all were answered. The patient agreed with the plan and demonstrated an understanding of the instructions.  A copy of instructions were sent to the patient via MyChart unless otherwise noted below.   The patient was advised to call back or seek an in-person evaluation if the symptoms worsen or if the condition fails to improve as anticipated.  Time:  I spent 7 minutes with the patient via telehealth technology discussing the above problems/concerns.    Gildardo Pounds, NP

## 2022-07-20 NOTE — ED Provider Notes (Signed)
[facility]    CSN: [id number] Arrival date & time: 07/20/22  1007      History   Chief Complaint Chief Complaint  Patient presents with   Back Pain    HPI Ruth Lin is a 26 y.o. female.   Patient presents with persisting working standing centralized and right-sided back pain for 2 days.  Initially had accompanying hematuria, lower abdominal pressure and dysuria, completed a visit with her PCP and started on Bactrim for UTI, dysuria and hematuria have resolved.  Patient also having symptoms of URI such as body aches, fever, chills and sore throat, tested positive for COVID.  Currently lactating.  Last bowel movement this morning, described as yellow and formed, had taken a dose of MiraLAX if she has not had a bowel movement for 2 days.  History of constipation.    Past Medical History:  Diagnosis Date   Anemia    Hemorrhoid    SVD (spontaneous vaginal delivery) 08/11/2018    Patient Active Problem List   Diagnosis Date Noted   Dichorionic diamniotic twin pregnancy in third trimester 01/15/2021   S/P cesarean section 01/15/2021   Normal pregnancy in multigravida in third trimester 08/11/2018   SVD (spontaneous vaginal delivery) 08/11/2018    Past Surgical History:  Procedure Laterality Date   CESAREAN SECTION MULTI-GESTATIONAL N/A 01/15/2021   Procedure: CESAREAN SECTION MULTI-GESTATIONAL;  Surgeon: Cheri Fowler, MD;  Location: [facility] LD ORS;  Service: Obstetrics;  Laterality: N/A;   NO PAST SURGERIES     WISDOM TOOTH EXTRACTION Bilateral 2017    OB History     Gravida  4   Para  4   Term  3   Preterm      AB      Living  4      SAB      IAB      Ectopic      Multiple  1   Live Births  4            Home Medications    Prior to Admission medications   Medication Sig Start Date End Date Taking? Authorizing Provider  linaclotide Rolan Lipa) 145 MCG CAPS capsule Take 1 capsule (145 mcg total) by mouth daily before breakfast. 07/09/22  07/04/23  Vladimir Crofts, PA-C    Family History Family History  Problem Relation Age of Onset   Colon polyps Father    Diabetes Maternal Grandfather    Stomach cancer Neg Hx    Esophageal cancer Neg Hx    Colon cancer Neg Hx     Social History Social History   Tobacco Use   Smoking status: Never   Smokeless tobacco: Never  Vaping Use   Vaping Use: Never used  Substance Use Topics   Alcohol use: No   Drug use: No     Allergies   Patient has no known allergies.   Review of Systems Review of Systems  Genitourinary:  Positive for flank pain. Negative for decreased urine volume, difficulty urinating, dyspareunia, dysuria, enuresis, frequency, genital sores, hematuria, menstrual problem, pelvic pain, urgency, vaginal bleeding, vaginal discharge and vaginal pain.     Physical Exam Triage Vital Signs ED Triage Vitals  Enc Vitals Group     BP 07/20/22 1057 127/82     Pulse Rate 07/20/22 1057 (!) 113     Resp 07/20/22 1057 12     Temp 07/20/22 1057 98.8 F (37.1 C)     Temp Source 07/20/22 1057 Oral  SpO2 07/20/22 1057 100 %     Weight 07/20/22 1055 130 lb (59 kg)     Height 07/20/22 1055 5' (1.524 m)     Head Circumference --      Peak Flow --      Pain Score 07/20/22 1054 10     Pain Loc --      Pain Edu? --      Excl. in [place]? --    No data found.  Updated Vital Signs BP 127/82 (BP Location: Left Arm)   Pulse (!) 113   Temp 98.8 F (37.1 C) (Oral)   Resp 12   Ht 5' (1.524 m)   Wt 130 lb (59 kg)   LMP  (LMP Unknown)   SpO2 100%   Breastfeeding Yes   BMI 25.39 kg/m   Visual Acuity Right Eye Distance:   Left Eye Distance:   Bilateral Distance:    Right Eye Near:   Left Eye Near:    Bilateral Near:     Physical Exam Constitutional:      Appearance: Normal appearance.  HENT:     Head: Normocephalic.  Eyes:     Extraocular Movements: Extraocular movements intact.  Pulmonary:     Effort: Pulmonary effort is normal.  Abdominal:      General: Abdomen is flat. Bowel sounds are normal.     Palpations: Abdomen is soft.     Tenderness: There is abdominal tenderness in the periumbilical area and suprapubic area.  Musculoskeletal:     Comments: Spinal tenderness to the thoracic and lumbar region, right-sided lumbar tenderness present, no ecchymosis swelling or deformity, straight leg test positive, unable to complete range of motion due to pain elicited  Neurological:     Mental Status: She is alert and oriented to person, place, and time. Mental status is at baseline.  Psychiatric:        Behavior: Behavior normal.      UC Treatments / Results  Labs (all labs ordered are listed, but only abnormal results are displayed) Labs Reviewed - No data to display  EKG   Radiology No results found.  Procedures Procedures (including critical care time)  Medications Ordered in UC Medications - No data to display  Initial Impression / Assessment and Plan / UC Course  I have reviewed the triage vital signs and the nursing notes.  Pertinent labs & imaging results that were available during my care of the patient were reviewed by me and considered in my medical decision making (see chart for details).  Acute midline low back pain without sciatica, acute midline thoracic back pain  Etiology unknown, could be a combination of concurrent infections also patient has 2 small children and is most likely lifting, discussed this with patient, as hematuria has resolved low suspicion for kidney stone, negative CVA tenderness as well, unable to retest urine as currently taking Bactrim which will skew results, abdominal x-ray negative, discussed with patient, will treat pain, prescribed prednisone and Flexeril, recommended RICE, heat, daily stretching and massage as tolerated, given strict precautions that of back pain continues to worsen in severity she is to go to the nearest emergency department Final Clinical Impressions(s) / UC  Diagnoses   Final diagnoses:  None   Discharge Instructions   None    ED Prescriptions   None    PDMP not reviewed this encounter.   Hans Eden, [place] 07/20/22 1208

## 2022-07-20 NOTE — ED Triage Notes (Signed)
Pt was treated for a UTI on 07/17/2022 and is currently taking antibiotics. Pt tested positive for covid at home after appt on 07/17/2022. Pt is here today because she is still having back pain and wanted to rule out kidney stones

## 2022-07-20 NOTE — Patient Instructions (Signed)
  Marsa Aris, thank you for joining Gildardo Pounds, NP for today's virtual visit.  While this provider is not your primary care provider (PCP), if your PCP is located in our provider database this encounter information will be shared with them immediately following your visit.  Consent: (Patient) Britteney Ayotte provided verbal consent for this virtual visit at the beginning of the encounter.  Current Medications:  Current Outpatient Medications:    linaclotide (LINZESS) 145 MCG CAPS capsule, Take 1 capsule (145 mcg total) by mouth daily before breakfast., Disp: 90 capsule, Rfl: 3   Medications ordered in this encounter:  No orders of the defined types were placed in this encounter.    *If you need refills on other medications prior to your next appointment, please contact your pharmacy*  Follow-Up: Call back or seek an in-person evaluation if the symptoms worsen or if the condition fails to improve as anticipated.  [place] [phone]  Other Instructions Needs Face to Face visit at urgent care   If you have been instructed to have an in-person evaluation today at a local Urgent Care facility, please use the link below. It will take you to a list of all of our available [facility], including address, phone number and hours of operation. Please do not delay care.  [facility]  If you or a family member do not have a primary care provider, use the link below to schedule a visit and establish care. When you choose a [facility] primary care physician or advanced practice provider, you gain a long-term partner in health. Find a Primary Care Provider  Learn more about 's in-office and virtual care options: [facility]

## 2022-07-21 ENCOUNTER — Emergency Department (HOSPITAL_COMMUNITY): Payer: Managed Care, Other (non HMO)

## 2022-07-21 DIAGNOSIS — K429 Umbilical hernia without obstruction or gangrene: Secondary | ICD-10-CM | POA: Diagnosis not present

## 2022-07-21 DIAGNOSIS — R319 Hematuria, unspecified: Secondary | ICD-10-CM | POA: Diagnosis not present

## 2022-07-21 DIAGNOSIS — N3000 Acute cystitis without hematuria: Secondary | ICD-10-CM | POA: Diagnosis not present

## 2022-07-21 DIAGNOSIS — R110 Nausea: Secondary | ICD-10-CM | POA: Diagnosis not present

## 2022-07-21 DIAGNOSIS — Z419 Encounter for procedure for purposes other than remedying health state, unspecified: Secondary | ICD-10-CM | POA: Diagnosis not present

## 2022-07-21 MED ORDER — LIDOCAINE 5 % EX PTCH: 1.0000 | MEDICATED_PATCH | CUTANEOUS | 0 refills | Status: AC

## 2022-07-21 MED ORDER — KETOROLAC TROMETHAMINE 15 MG/ML IJ SOLN
15.0000 mg | Freq: Once | INTRAMUSCULAR | Status: AC
  Administered 2022-07-21: 15 mg via INTRAVENOUS
  Filled 2022-07-21: qty 1

## 2022-07-21 MED ORDER — LACTATED RINGERS IV BOLUS
1000.0000 mL | Freq: Once | INTRAVENOUS | Status: AC
  Administered 2022-07-21: 1000 mL via INTRAVENOUS

## 2022-07-21 MED ORDER — DIAZEPAM 5 MG PO TABS
5.0000 mg | ORAL_TABLET | Freq: Once | ORAL | Status: DC
  Filled 2022-07-21: qty 1

## 2022-07-21 MED ORDER — LIDOCAINE 5 % EX PTCH
1.0000 | MEDICATED_PATCH | CUTANEOUS | Status: DC
  Administered 2022-07-21: 1 via TRANSDERMAL
  Filled 2022-07-21: qty 1

## 2022-07-21 MED ORDER — HYDROCODONE-ACETAMINOPHEN 5-325 MG PO TABS
1.0000 | ORAL_TABLET | Freq: Once | ORAL | Status: AC
  Administered 2022-07-21: 1 via ORAL
  Filled 2022-07-21: qty 1

## 2022-07-21 NOTE — Discharge Instructions (Addendum)
Your work-up today was overall reassuring.  I will send a urine culture to confirm if you have a UTI and if the antibiotics you are on are adequate.  He had improvement to your back pain following medications in the emergency room.  I have sent lidocaine patch in addition to the medicines you are prescribed from the urgent care.  For any concerning symptoms return to the emergency room continue taking your antibiotic you are prescribed.  Otherwise follow-up with your primary care doctor. Follow-up with your PCP regarding lung findings and your CT scan.  These not concerning but need to be monitored.

## 2022-07-21 NOTE — ED Provider Notes (Addendum)
[place] EMERGENCY DEPARTMENT Provider Note   CSN: [id number] Arrival date & time: 07/20/22  1948     History  Chief Complaint  Patient presents with   Back Pain    Ruth Lin is a 26 y.o. female.  26 year old female presents today for evaluation of back pain that has been ongoing for 3 days.  Started Thursday along with hematuria, dysuria.  She states along with hematuria she had dysuria.  She denies vaginal bleeding, vaginal discharge.  States she is not sexually active currently.  States she has not been for about a year.  States since she had her C-section last year she has been having pelvic pain that she saw GYN for.  She states no change in this pain.  She was evaluated by her GYN on Thursday and started on Bactrim.  Then she was seen at urgent care and referred to the emergency room to have a CT scan done to rule out kidney stone.  Reports resolution of hematuria at this point.  Still has dysuria.  Reports compliance with Bactrim.  She also states Thursday after she was started on Bactrim she was COVID tested and tested positive.  Back pain has not significantly worsened since Thursday.  She denies history of IV drug use, malignancy, saddle anesthesia, bowel or bladder incontinence, weakness, trauma to her back.  Denies history of back pain or instrumentation to her back.  The history is provided by the patient. No language interpreter was used.       Home Medications Prior to Admission medications   Medication Sig Start Date End Date Taking? Authorizing Provider  cyclobenzaprine (FLEXERIL) 5 MG tablet Take 1 tablet (5 mg total) by mouth 3 (three) times daily as needed for muscle spasms. 07/20/22   Hans Eden, NP  linaclotide [facility]) 145 MCG CAPS capsule Take 1 capsule (145 mcg total) by mouth daily before breakfast. 07/09/22 07/04/23  Vladimir Crofts, PA-C  predniSONE (DELTASONE) 20 MG tablet Take 2 tablets (40 mg total) by mouth daily. 07/20/22    Hans Eden, NP      Allergies    Patient has no known allergies.    Review of Systems   Review of Systems  Constitutional:  Positive for chills and fever.  Respiratory:  Negative for shortness of breath.   Gastrointestinal:  Positive for abdominal pain. Negative for nausea and vomiting.  Genitourinary:  Positive for dysuria. Negative for hematuria, pelvic pain, vaginal bleeding and vaginal discharge.  All other systems reviewed and are negative.   Physical Exam Updated Vital Signs BP 124/83   Pulse 75   Temp 98.3 F (36.8 C) (Oral)   Resp 17   Ht 5' (1.524 m)   Wt 59 kg   LMP  (LMP Unknown)   SpO2 100%   Breastfeeding Yes   BMI 25.39 kg/m  Physical Exam Vitals and nursing note reviewed.  Constitutional:      General: She is not in acute distress.    Appearance: Normal appearance. She is not ill-appearing.  HENT:     Head: Normocephalic and atraumatic.     Nose: Nose normal.  Eyes:     General: No scleral icterus.    Extraocular Movements: Extraocular movements intact.     Conjunctiva/sclera: Conjunctivae normal.  Cardiovascular:     Rate and Rhythm: Normal rate and regular rhythm.     Pulses: Normal pulses.  Pulmonary:     Effort: Pulmonary effort is normal. No respiratory distress.  Breath sounds: Normal breath sounds. No wheezing or rales.  Abdominal:     General: There is no distension.     Palpations: Abdomen is soft.     Tenderness: There is no abdominal tenderness. There is no guarding.  Musculoskeletal:        General: Normal range of motion.     Cervical back: Normal range of motion.     Comments: Cervical, thoracic without lumbar spinal process tenderness.  Lumbar spine and paraspinal muscles with generalized tenderness palpation present.  5/5 strength in bilateral lower extremities with full range of motion.  Patient ambulatory without difficulty.  Skin:    General: Skin is warm and dry.  Neurological:     General: No focal deficit  present.     Mental Status: She is alert. Mental status is at baseline.     ED Results / Procedures / Treatments   Labs (all labs ordered are listed, but only abnormal results are displayed) Labs Reviewed  URINALYSIS, ROUTINE W REFLEX MICROSCOPIC - Abnormal; Notable for the following components:      Result Value   Color, Urine STRAW (*)    All other components within normal limits  BASIC METABOLIC PANEL - Abnormal; Notable for the following components:   Creatinine, Ser 1.02 (*)    All other components within normal limits  CBC  I-STAT BETA HCG BLOOD, ED ([facility], [facility], [facility] ONLY)    EKG None  Radiology CT Renal Stone Study  Result Date: 07/21/2022 CLINICAL DATA:  Hematuria, nausea, and flank pain. EXAM: CT ABDOMEN AND PELVIS WITHOUT CONTRAST TECHNIQUE: Multidetector CT imaging of the abdomen and pelvis was performed following the standard protocol without IV contrast. RADIATION DOSE REDUCTION: This exam was performed according to the departmental dose-optimization program which includes automated exposure control, adjustment of the mA and/or kV according to patient size and/or use of iterative reconstruction technique. COMPARISON:  None. FINDINGS: Lower chest: Few scattered pulmonary nodules are noted at the lung bases bilaterally measuring up to 5 mm in the left lower lobe, axial image 4. Hepatobiliary: No focal liver abnormality is seen. No gallstones, gallbladder wall thickening, or biliary dilatation. Pancreas: Unremarkable. No pancreatic ductal dilatation or surrounding inflammatory changes. Spleen: Normal in size without focal abnormality. Adrenals/Urinary Tract: The adrenal glands are within normal limits. No renal calculus or hydronephrosis. The bladder is unremarkable. Stomach/Bowel: Stomach is within normal limits. Appendix appears normal. No evidence of bowel wall thickening, distention, or inflammatory changes. No free air or pneumatosis. Vascular/Lymphatic: No significant vascular  findings are present. No enlarged abdominal or pelvic lymph nodes. Reproductive: Uterus and bilateral adnexa are unremarkable. Other: No abdominopelvic ascites. Small fat containing umbilical hernia. Musculoskeletal: No acute osseous abnormality. IMPRESSION: 1. No renal calculus or hydronephrosis bilaterally. 2. Scattered pulmonary nodules at the lung bases measuring up to 5 mm. No follow-up needed if patient is low-risk (and has no known or suspected primary neoplasm). Non-contrast chest CT can be considered in 12 months if patient is high-risk. This recommendation follows the consensus statement: Guidelines for Management of Incidental Pulmonary Nodules Detected on CT Images: From the Fleischner Society 2017; Radiology 2017; 284:228-243. Electronically Signed   By: Brett Fairy M.D.   On: 07/21/2022 00:33   DG Abd 1 View  Result Date: 07/20/2022 CLINICAL DATA:  Flank pain EXAM: ABDOMEN - 1 VIEW COMPARISON:  None Available. FINDINGS: No dilated large or small bowel.  Gas in the the rectum. No pathologic calcifications. No organomegaly. No ureterolithiasis. IMPRESSION: 1. No ureterolithiasis.  2. No bowel obstruction. Electronically Signed   By: Suzy Bouchard M.D.   On: 07/20/2022 11:53    Procedures Procedures    Medications Ordered in ED Medications  lidocaine (LIDODERM) 5 % 1 patch (1 patch Transdermal Patch Applied 07/21/22 1032)  acetaminophen (TYLENOL) tablet 650 mg (650 mg Oral Given 07/20/22 2139)  lactated ringers bolus 1,000 mL (1,000 mLs Intravenous New Bag/Given 07/21/22 1030)  HYDROcodone-acetaminophen (NORCO/VICODIN) 5-325 MG per tablet 1 tablet (1 tablet Oral Given 07/21/22 1034)  ketorolac (TORADOL) 15 MG/ML injection 15 mg (15 mg Intravenous Given 07/21/22 1034)    ED Course/ Medical Decision Making/ A&P                           Medical Decision Making Amount and/or Complexity of Data Reviewed Labs: ordered.  Risk OTC drugs. Prescription drug management.   Medical  Decision Making / ED Course   This patient presents to the ED for concern of back pain, this involves an extensive number of treatment options, and is a complaint that carries with it a high risk of complications and morbidity.  The differential diagnosis includes cauda equina syndrome, spinal epidural abscess, pyelonephritis, UTI, PID  MDM: 26 year old female presents with above-mentioned complaint.  Since Thursday she has had hematuria, dysuria, back pain.  She is also COVID-positive.  She was given Flexeril, and prednisone from urgent care without significant relief.  However she has not started the prednisone.  UA without evidence of UTI however she has been on Bactrim since Thursday.  Will send urine culture.  We will provide multimodal pain regimen in the emergency room and reevaluate.  Will provide IV fluids.  Low suspicion for PID as she has not been sexually active in about a year.  Likely multifactorial back pain with UTI, as well as COVID.  On reevaluation patient has improvement in her neck pain.  We will send in lidocaine patch, and she can continue her prednisone and Flexeril that she was previously prescribed.  She can continue her Bactrim for the UTI.  Patient is appropriate for discharge.  Discharged in stable condition.  Return precautions discussed.  Patient voices understanding and is in agreement with plan.  CT renal stone study shows no evidence of kidney stone, appendicitis, or other acute finding.  Without changes concerning for pyelonephritis.  Exam with negative straight leg raise test bilaterally.  CT renal stone study also showed subcentimeter scattered pulmonary nodules.  Patient without respiratory symptoms.  Discussed follow-up with PCP regarding CT results and ongoing monitoring.   Additional history obtained: -Additional history obtained from recent urgent care visit -External records from outside source obtained and reviewed including: Chart review including previous  notes, labs, imaging, consultation notes   Lab Tests: -I ordered, reviewed, and interpreted labs.   The pertinent results include:   Labs Reviewed  URINALYSIS, ROUTINE W REFLEX MICROSCOPIC - Abnormal; Notable for the following components:      Result Value   Color, Urine STRAW (*)    All other components within normal limits  BASIC METABOLIC PANEL - Abnormal; Notable for the following components:   Creatinine, Ser 1.02 (*)    All other components within normal limits  URINE CULTURE  CBC  I-STAT BETA HCG BLOOD, ED ([facility], [facility], [facility] ONLY)      EKG  EKG Interpretation  Date/Time:    Ventricular Rate:    PR Interval:    QRS Duration:   QT Interval:  QTC Calculation:   R Axis:     Text Interpretation:           Imaging Studies ordered: I ordered imaging studies including CT renal stone study I independently visualized and interpreted imaging. I agree with the radiologist interpretation   Medicines ordered and prescription drug management: Meds ordered this encounter  Medications   acetaminophen (TYLENOL) tablet 650 mg   lactated ringers bolus 1,000 mL   DISCONTD: diazepam (VALIUM) tablet 5 mg   lidocaine (LIDODERM) 5 % 1 patch   HYDROcodone-acetaminophen (NORCO/VICODIN) 5-325 MG per tablet 1 tablet   ketorolac (TORADOL) 15 MG/ML injection 15 mg    -I have reviewed the patients home medicines and have made adjustments as needed  Reevaluation: After the interventions noted above, I reevaluated the patient and found that they have :improved  Co morbidities that complicate the patient evaluation  Past Medical History:  Diagnosis Date   Anemia    Hemorrhoid    SVD (spontaneous vaginal delivery) 08/11/2018      Dispostion: Patient is appropriate for discharge.  Discharged in stable condition.  Return precautions discussed.  Final Clinical Impression(s) / ED Diagnoses Final diagnoses:  Acute bilateral low back pain without sciatica  Acute cystitis without  hematuria  COVID-19    Rx / DC Orders ED Discharge Orders          Ordered    lidocaine (LIDODERM) 5 %  Every 24 hours        07/21/22 1119              Evlyn Courier, PA-C 07/21/22 [place], DO 07/21/22 1134    Evlyn Courier, PA-C 07/21/22 1205    Deno Etienne, DO 07/21/22 1528

## 2022-07-21 NOTE — ED Notes (Signed)
Called pt multiple times for vitals with no answer  

## 2022-07-23 LAB — URINE CULTURE: Culture: >=100000 — AB

## 2022-07-24 ENCOUNTER — Telehealth (HOSPITAL_BASED_OUTPATIENT_CLINIC_OR_DEPARTMENT_OTHER): Payer: Self-pay | Admitting: *Deleted

## 2022-07-24 NOTE — Telephone Encounter (Signed)
Unable to reach [facility] on [address].  Spoke with patient and prescription for Keflex 500mg  QID x 5 days Chrys Racer [place], [place]) called to [facility] on [address].

## 2022-07-24 NOTE — Telephone Encounter (Signed)
Post ED Visit - Positive Culture Follow-up: Successful Patient Follow-Up  Culture assessed and recommendations reviewed by:  []  Elenor Quinones, Pharm.D. [x]  Heide Guile, Pharm.D., BCPS AQ-ID []  Parks Neptune, Pharm.D., BCPS []  Alycia Rossetti, Pharm.D., BCPS []  [place], Pharm.D., BCPS, AAHIVP []  Legrand Como, Pharm.D., BCPS, AAHIVP []  Salome Arnt, PharmD, BCPS []  Johnnette Gourd, PharmD, BCPS []  Hughes Better, PharmD, BCPS []  Leeroy Cha, PharmD  Positive urine culture  []  Patient discharged without antimicrobial prescription and treatment is now indicated [x]  Organism is resistant to prescribed ED discharge antimicrobial []  Patient with positive blood cultures  Changes discussed with ED provider: Charmaine Downs, PA New antibiotic prescription Keflex 500mg  PO QID x 5 days, Stop Bactrim [facility], [address] [phone]  Contacted patient, date 07/24/2022, time Gutierrez, [place] 07/24/2022, 11:30 AM

## 2022-07-24 NOTE — Progress Notes (Addendum)
ED Antimicrobial Stewardship Positive Culture Follow Up   Ruth Lin is an 26 y.o. female who presented to [facility] on 07/20/2022 with a chief complaint of back pain and dysuria. Recently seen at GYN (9/28) and given bactrim for UTI. Chief Complaint  Patient presents with   Back Pain    Recent Results (from the past 720 hour(s))  Urine Culture     Status: Abnormal   Collection Time: 07/21/22  1:47 PM   Specimen: Urine, Clean Catch  Result Value Ref Range Status   Specimen Description URINE, CLEAN CATCH  Final   Special Requests   Final    NONE Performed at [facility], [address], [place], [place] [address]    Culture >=100,000 COLONIES/mL STAPHYLOCOCCUS EPIDERMIDIS (A)  Final   Report Status 07/23/2022 FINAL  Final   Organism ID, Bacteria STAPHYLOCOCCUS EPIDERMIDIS (A)  Final      Susceptibility   Staphylococcus epidermidis - MIC*    CIPROFLOXACIN <=0.5 SENSITIVE Sensitive     GENTAMICIN <=0.5 SENSITIVE Sensitive     NITROFURANTOIN <=16 SENSITIVE Sensitive     OXACILLIN <=0.25 SENSITIVE Sensitive     TETRACYCLINE 2 SENSITIVE Sensitive     VANCOMYCIN 1 SENSITIVE Sensitive     TRIMETH/SULFA 160 RESISTANT Resistant     CLINDAMYCIN <=0.25 SENSITIVE Sensitive     RIFAMPIN <=0.5 SENSITIVE Sensitive     Inducible Clindamycin NEGATIVE Sensitive     * >=100,000 COLONIES/mL STAPHYLOCOCCUS EPIDERMIDIS    [x]  Treated with bactrim, organism resistant to prescribed antimicrobial []  Patient discharged originally without antimicrobial agent and treatment is now indicated   Recommend to stop taking Bactrim. New antibiotic prescription: keflex 500 mg four times daily for 5 days.  ED Provider: Charmaine Downs, PA-C  Louanne Belton, PharmD, [facility] PGY1 Pharmacy Resident 07/24/2022 9:56 AM

## 2022-07-25 ENCOUNTER — Encounter: Payer: Managed Care, Other (non HMO) | Admitting: Internal Medicine

## 2022-08-21 DIAGNOSIS — Z419 Encounter for procedure for purposes other than remedying health state, unspecified: Secondary | ICD-10-CM | POA: Diagnosis not present

## 2022-09-20 DIAGNOSIS — Z419 Encounter for procedure for purposes other than remedying health state, unspecified: Secondary | ICD-10-CM | POA: Diagnosis not present

## 2022-10-21 DIAGNOSIS — Z419 Encounter for procedure for purposes other than remedying health state, unspecified: Secondary | ICD-10-CM | POA: Diagnosis not present

## 2022-11-21 DIAGNOSIS — Z419 Encounter for procedure for purposes other than remedying health state, unspecified: Secondary | ICD-10-CM | POA: Diagnosis not present

## 2022-12-20 DIAGNOSIS — Z419 Encounter for procedure for purposes other than remedying health state, unspecified: Secondary | ICD-10-CM | POA: Diagnosis not present

## 2023-01-20 DIAGNOSIS — Z419 Encounter for procedure for purposes other than remedying health state, unspecified: Secondary | ICD-10-CM | POA: Diagnosis not present

## 2023-02-19 DIAGNOSIS — Z419 Encounter for procedure for purposes other than remedying health state, unspecified: Secondary | ICD-10-CM | POA: Diagnosis not present

## 2023-03-22 DIAGNOSIS — Z419 Encounter for procedure for purposes other than remedying health state, unspecified: Secondary | ICD-10-CM | POA: Diagnosis not present

## 2023-03-31 ENCOUNTER — Other Ambulatory Visit: Payer: Self-pay | Admitting: Family Medicine

## 2023-03-31 DIAGNOSIS — R42 Dizziness and giddiness: Secondary | ICD-10-CM

## 2023-03-31 DIAGNOSIS — R4189 Other symptoms and signs involving cognitive functions and awareness: Secondary | ICD-10-CM

## 2023-03-31 DIAGNOSIS — R519 Headache, unspecified: Secondary | ICD-10-CM

## 2023-04-06 ENCOUNTER — Ambulatory Visit
Admission: RE | Admit: 2023-04-06 | Discharge: 2023-04-06 | Disposition: A | Discharge status: Home / Self Care | Payer: Managed Care, Other (non HMO) | Source: Ambulatory Visit | Attending: Family Medicine | Admitting: Family Medicine

## 2023-04-06 DIAGNOSIS — R519 Headache, unspecified: Secondary | ICD-10-CM | POA: Diagnosis not present

## 2023-04-06 DIAGNOSIS — R410 Disorientation, unspecified: Secondary | ICD-10-CM | POA: Diagnosis not present

## 2023-04-06 DIAGNOSIS — R42 Dizziness and giddiness: Secondary | ICD-10-CM | POA: Diagnosis not present

## 2023-04-06 DIAGNOSIS — R4189 Other symptoms and signs involving cognitive functions and awareness: Secondary | ICD-10-CM

## 2023-04-21 DIAGNOSIS — Z419 Encounter for procedure for purposes other than remedying health state, unspecified: Secondary | ICD-10-CM | POA: Diagnosis not present

## 2023-05-22 DIAGNOSIS — Z419 Encounter for procedure for purposes other than remedying health state, unspecified: Secondary | ICD-10-CM | POA: Diagnosis not present

## 2023-06-22 DIAGNOSIS — Z419 Encounter for procedure for purposes other than remedying health state, unspecified: Secondary | ICD-10-CM | POA: Diagnosis not present

## 2023-07-22 DIAGNOSIS — Z419 Encounter for procedure for purposes other than remedying health state, unspecified: Secondary | ICD-10-CM | POA: Diagnosis not present

## 2023-08-22 DIAGNOSIS — Z419 Encounter for procedure for purposes other than remedying health state, unspecified: Secondary | ICD-10-CM | POA: Diagnosis not present

## 2023-09-21 DIAGNOSIS — Z419 Encounter for procedure for purposes other than remedying health state, unspecified: Secondary | ICD-10-CM | POA: Diagnosis not present

## 2023-10-22 DIAGNOSIS — Z419 Encounter for procedure for purposes other than remedying health state, unspecified: Secondary | ICD-10-CM | POA: Diagnosis not present

## 2023-11-22 DIAGNOSIS — Z419 Encounter for procedure for purposes other than remedying health state, unspecified: Secondary | ICD-10-CM | POA: Diagnosis not present

## 2024-02-22 ENCOUNTER — Emergency Department (HOSPITAL_COMMUNITY)
Admission: EM | Admit: 2024-02-22 | Discharge: 2024-02-22 | Disposition: A | Discharge status: Home / Self Care | Attending: Emergency Medicine | Admitting: Emergency Medicine

## 2024-02-22 ENCOUNTER — Other Ambulatory Visit: Payer: Self-pay

## 2024-02-22 ENCOUNTER — Emergency Department (HOSPITAL_COMMUNITY)

## 2024-02-22 DIAGNOSIS — R519 Headache, unspecified: Secondary | ICD-10-CM | POA: Insufficient documentation

## 2024-02-22 MED ORDER — METHOCARBAMOL 500 MG PO TABS: 500.0000 mg | ORAL_TABLET | Freq: Two times a day (BID) | ORAL | 0 refills | Status: AC

## 2024-02-22 NOTE — ED Provider Notes (Signed)
 [place] EMERGENCY DEPARTMENT AT [facility] Provider Note   CSN: [id number] Arrival date & time: 02/22/24  1230     History  Chief Complaint  Patient presents with   Headache   Blurred Vision    Ruth Lin is a 28 y.o. female.  Patient complains of a headache.  Patient reports that she has a headache every day.  Patient reports that she has had headaches every day for the past 2 years.  Patient reports that she has been evaluated in the past and had an MRI.  Patient is not on any medication currently for headaches.  She has not seen a neurologist.  Patient denies any fever or chills she has not had any sinus drainage no sore throat no neck pain.  Patient states that she gets tension in her scalp every day.  Patient states she tries to stay relaxed but still gets headaches.  The history is provided by the patient. No language interpreter was used.  Headache Pain location:  Generalized Radiates to:  Eyes Timing:  Intermittent Progression:  Worsening Chronicity:  Chronic Relieved by:  Nothing Worsened by:  Nothing Risk factors: does not have insomnia        Home Medications Prior to Admission medications   Medication Sig Start Date End Date Taking? Authorizing Provider  methocarbamol (ROBAXIN) 500 MG tablet Take 1 tablet (500 mg total) by mouth 2 (two) times daily. 02/22/24  Yes Elias Bordner K, PA-C  cyclobenzaprine  (FLEXERIL ) 5 MG tablet Take 1 tablet (5 mg total) by mouth 3 (three) times daily as needed for muscle spasms. 07/20/22   White, Maybelle Spatz, NP  lidocaine  (LIDODERM ) 5 % Place 1 patch onto the skin daily. Remove & Discard patch within 12 hours or as directed by MD 07/21/22   Lucina Sabal, PA-C  linaclotide  (LINZESS ) 145 MCG CAPS capsule Take 1 capsule (145 mcg total) by mouth daily before breakfast. 07/09/22 07/04/23  Edmonia Gottron, PA-C  predniSONE  (DELTASONE ) 20 MG tablet Take 2 tablets (40 mg total) by mouth daily. 07/20/22   Reena Canning, NP       Allergies    Patient has no known allergies.    Review of Systems   Review of Systems  Neurological:  Positive for headaches.  All other systems reviewed and are negative.   Physical Exam Updated Vital Signs BP 107/69   Pulse 72   Resp 16   Ht 5' (1.524 m)   Wt 59 kg   LMP 02/22/2024   SpO2 99%   BMI 25.40 kg/m  Physical Exam Vitals and nursing note reviewed.  Constitutional:      Appearance: She is well-developed.  HENT:     Head: Normocephalic.  Eyes:     Extraocular Movements: Extraocular movements intact.     Pupils: Pupils are equal, round, and reactive to light.  Cardiovascular:     Rate and Rhythm: Normal rate.  Pulmonary:     Effort: Pulmonary effort is normal.  Abdominal:     General: There is no distension.  Musculoskeletal:        General: Normal range of motion.     Cervical back: Normal range of motion.  Skin:    General: Skin is warm.  Neurological:     General: No focal deficit present.     Mental Status: She is alert and oriented to person, place, and time.  Psychiatric:        Mood and Affect: Mood normal.  ED Results / Procedures / Treatments   Labs (all labs ordered are listed, but only abnormal results are displayed) Labs Reviewed - No data to display  EKG None  Radiology CT Head Wo Contrast Result Date: 02/22/2024 CLINICAL DATA:  Headache EXAM: CT HEAD WITHOUT CONTRAST TECHNIQUE: Contiguous axial images were obtained from the base of the skull through the vertex without intravenous contrast. RADIATION DOSE REDUCTION: This exam was performed according to the departmental dose-optimization program which includes automated exposure control, adjustment of the mA and/or kV according to patient size and/or use of iterative reconstruction technique. COMPARISON:  None Available. FINDINGS: Brain: Normal anatomic configuration. No abnormal intra or extra-axial mass lesion or fluid collection. No abnormal mass effect or midline shift. No  evidence of acute intracranial hemorrhage or infarct. Ventricular size is normal. Cerebellum unremarkable. Vascular: Unremarkable Skull: Intact Sinuses/Orbits: Paranasal sinuses are clear. Orbits are unremarkable. Other: Mastoid air cells and middle ear cavities are clear. IMPRESSION: 1. Normal noncontrast CT of the head. Electronically Signed   By: Worthy Heads M.D.   On: 02/22/2024 14:55    Procedures Procedures    Medications Ordered in ED Medications - No data to display  ED Course/ Medical Decision Making/ A&P                                 Medical Decision Making Patient complains of having a headache every day for 2 years.  Amount and/or Complexity of Data Reviewed Labs: ordered. Decision-making details documented in ED Course.    Details: Patient denies risk of pregnancy she declines pregnancy test. Radiology: ordered and independent interpretation performed. Decision-making details documented in ED Course.    Details: CT head shows no acute findings.  Risk Prescription drug management. Risk Details: Patient is requesting imaging of her head.  Patient does not have any neurologic deficit.  Patient's symptoms sound consistent with tension headaches.  Pt advised that she should follow-up with neurology for headache evaluation.  CT head shows no acute abnormality.           Final Clinical Impression(s) / ED Diagnoses Final diagnoses:  Bad headache    Rx / DC Orders ED Discharge Orders          Ordered    methocarbamol (ROBAXIN) 500 MG tablet  2 times daily        02/22/24 1526           An After Visit Summary was printed and given to the patient.    Karren, [place], [place] 02/22/24 1545    Lind Repine, MD 02/25/24 2005

## 2024-02-22 NOTE — Discharge Instructions (Addendum)
 Follow up with Neurology for evaluation of headaches

## 2024-02-22 NOTE — ED Triage Notes (Signed)
 Pt states she has a headache. Reports she wakes up every single morning for 2 years every day with the same headache. Endorses visual changes. Sometimes blurred, sometimes see spots or eye twitches. Patient denies N/V. Has had MRI last year and was told everything was normal. No new changes today. Reports wants to check again.
